# Patient Record
Sex: Male | Born: 1970
Health system: Southern US, Community
[De-identification: ages and names within clinical notes are randomized; demographics above are authoritative.]

## PROBLEM LIST (undated history)

## (undated) DIAGNOSIS — I10 Essential (primary) hypertension: Secondary | ICD-10-CM

---

## 2008-11-27 ENCOUNTER — Emergency Department: Payer: Self-pay | Admitting: Emergency Medicine

## 2011-11-13 ENCOUNTER — Emergency Department: Payer: Self-pay | Admitting: *Deleted

## 2011-11-13 LAB — URINALYSIS, COMPLETE
Blood: NEGATIVE
Hyaline Cast: 13
Leukocyte Esterase: NEGATIVE
Nitrite: NEGATIVE
Protein: 100
RBC,UR: 1 /HPF (ref 0–5)
Specific Gravity: 1.036 (ref 1.003–1.030)
Squamous Epithelial: NONE SEEN
WBC UR: 2 /HPF (ref 0–5)

## 2011-11-13 LAB — CBC
HCT: 44.8 % (ref 40.0–52.0)
HGB: 15.3 g/dL (ref 13.0–18.0)
MCH: 31.9 pg (ref 26.0–34.0)
MCHC: 34.2 g/dL (ref 32.0–36.0)
MCV: 93 fL (ref 80–100)

## 2011-11-13 LAB — COMPREHENSIVE METABOLIC PANEL
Albumin: 4.7 g/dL (ref 3.4–5.0)
Alkaline Phosphatase: 56 U/L (ref 50–136)
Anion Gap: 10 (ref 7–16)
Bilirubin,Total: 0.5 mg/dL (ref 0.2–1.0)
Calcium, Total: 9.6 mg/dL (ref 8.5–10.1)
Co2: 23 mmol/L (ref 21–32)
Creatinine: 1.06 mg/dL (ref 0.60–1.30)
EGFR (Non-African Amer.): >60
Osmolality: 281 (ref 275–301)
Potassium: 3.3 mmol/L — ABNORMAL LOW (ref 3.5–5.1)
SGOT(AST): 26 U/L (ref 15–37)
SGPT (ALT): 28 U/L
Sodium: 138 mmol/L (ref 136–145)
Total Protein: 8.6 g/dL — ABNORMAL HIGH (ref 6.4–8.2)

## 2011-11-13 LAB — LIPASE, BLOOD: Lipase: 67 U/L — ABNORMAL LOW (ref 73–393)

## 2014-07-08 ENCOUNTER — Ambulatory Visit: Payer: Self-pay | Admitting: Otolaryngology

## 2014-11-24 DIAGNOSIS — H7190 Unspecified cholesteatoma, unspecified ear: Secondary | ICD-10-CM | POA: Insufficient documentation

## 2015-07-19 HISTORY — PX: OTHER SURGICAL HISTORY: SHX169

## 2016-10-12 IMAGING — CT CT TEMPORAL BONES WITHOUT CONTRAST
2 of 6 series · 12 of 40 positions shown, 15 images · non-contrast
Comparison: None.

CLINICAL DATA: 43-year-old male with right-sided hearing loss for 3
months. Bloody leakage from ear last week. Initial encounter.

EXAM:
CT TEMPORAL BONES WITHOUT CONTRAST
TECHNIQUE: Axial and coronal plane CT imaging of the petrous temporal bones was
performed with thin-collimation image reconstruction. No intravenous
contrast was administered. Multiplanar CT image reconstructions were
also generated.

[Series 5: coronal bone · coronal · 0.11mm/px · 2 of 127 slices shown]
[im 43/127  bone]
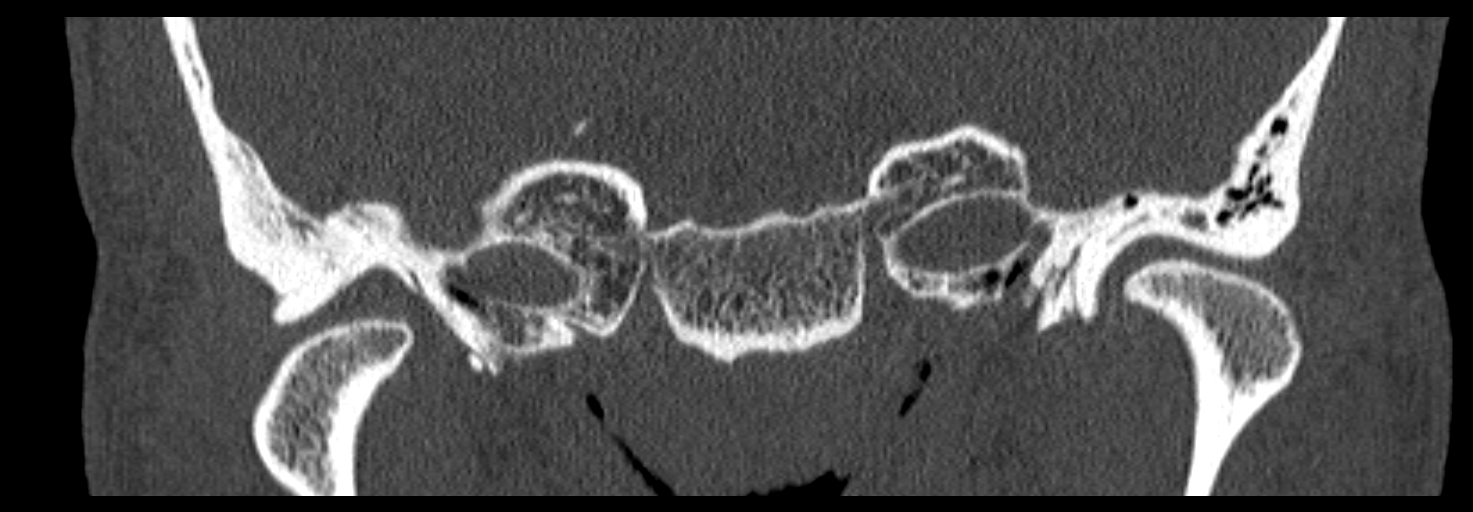
[im 85/127  bone]
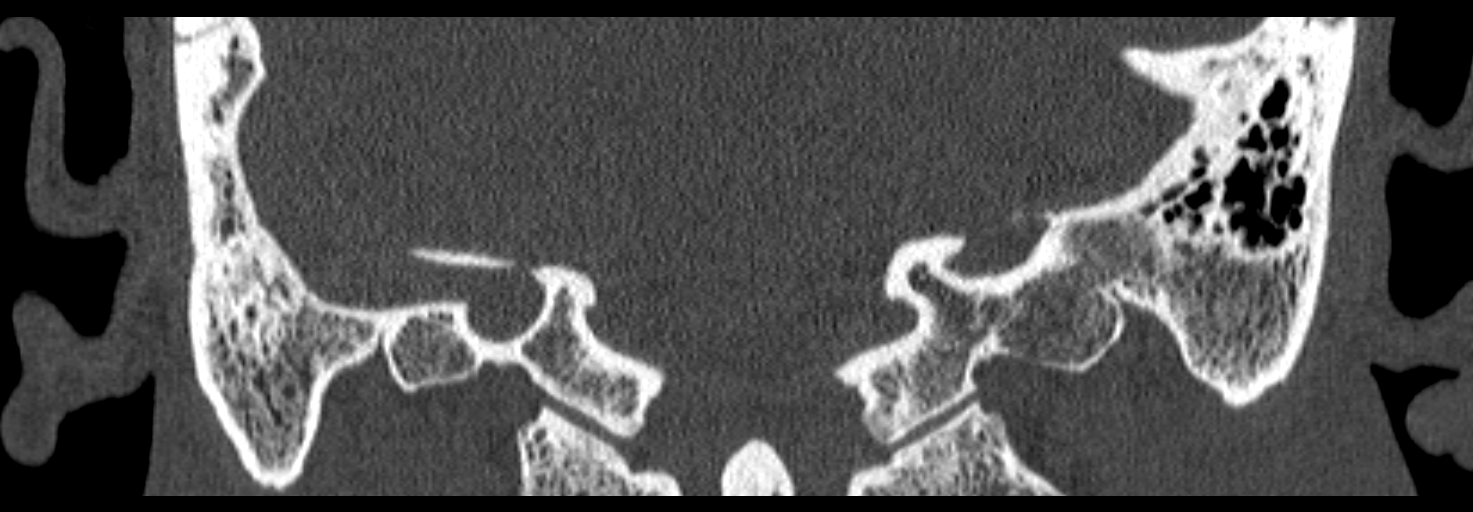

[Series 6: ax mag right · axial · 0.20mm/px · z∈[-64,-20]mm · 10 of 90 slices shown, 13 images]
[im 8/90  brain]
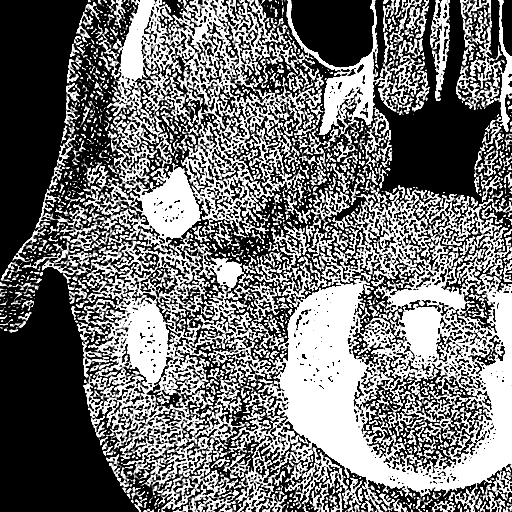
[im 8/90  bone]
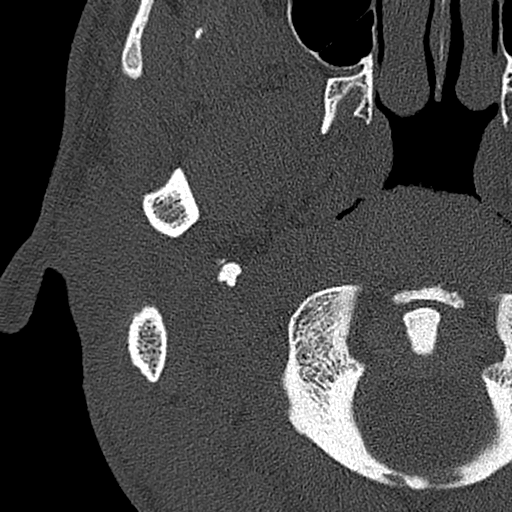
[im 15/90  bone]
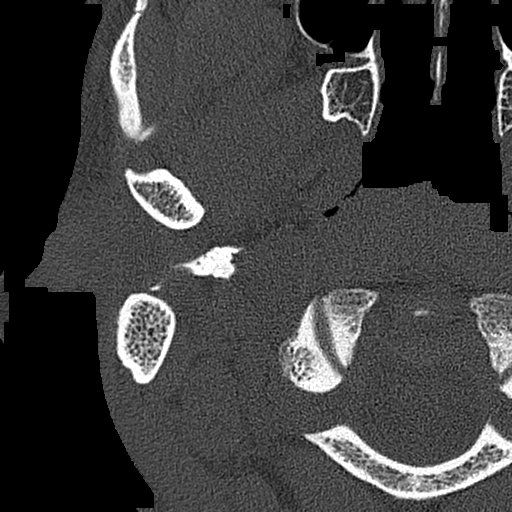
[im 23/90  bone]
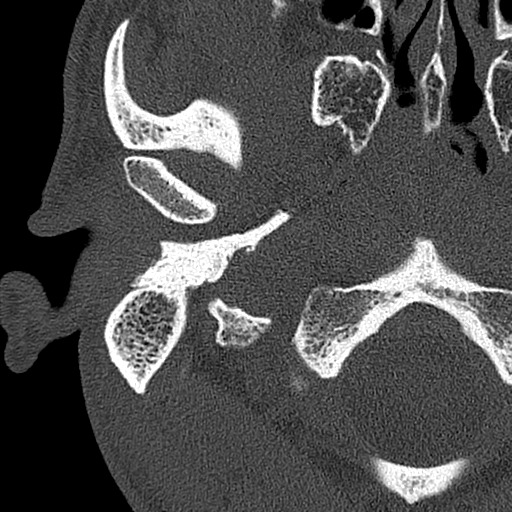
[im 30/90  bone]
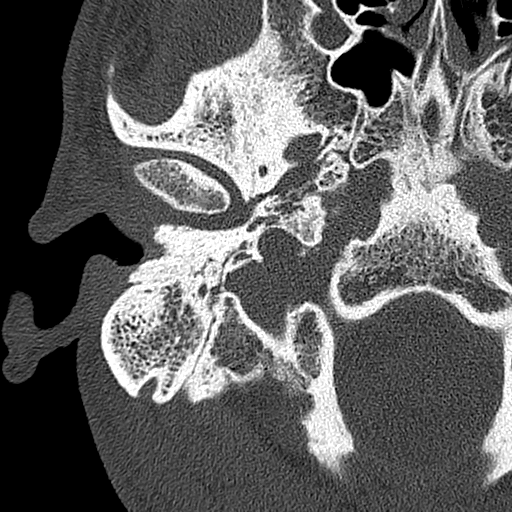
[im 38/90  brain]
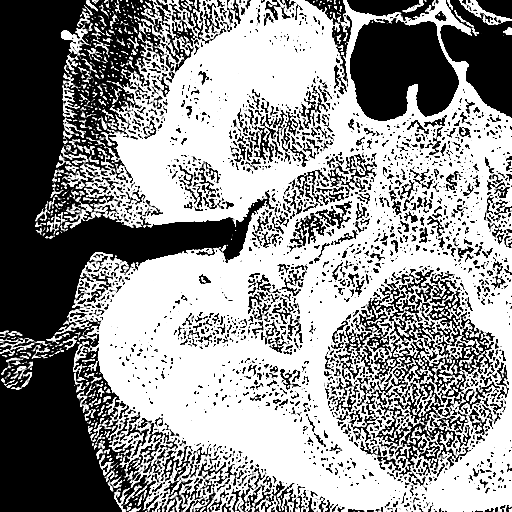
[im 38/90  bone]
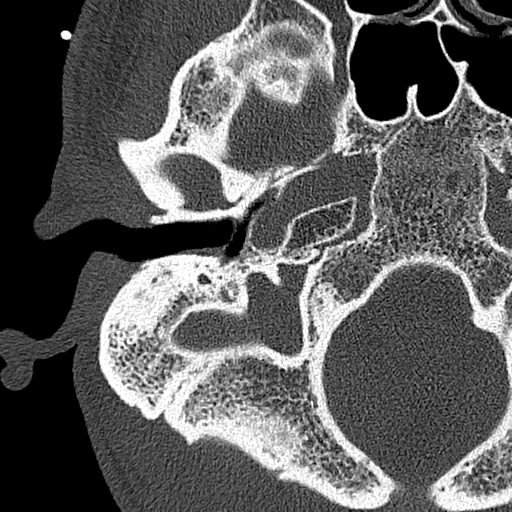
[im 52/90  bone]
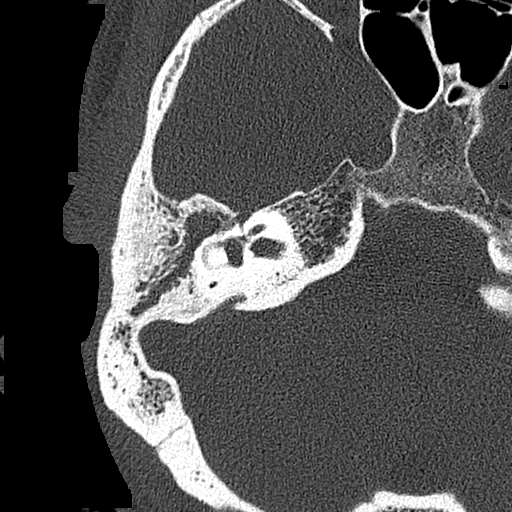
[im 60/90  bone]
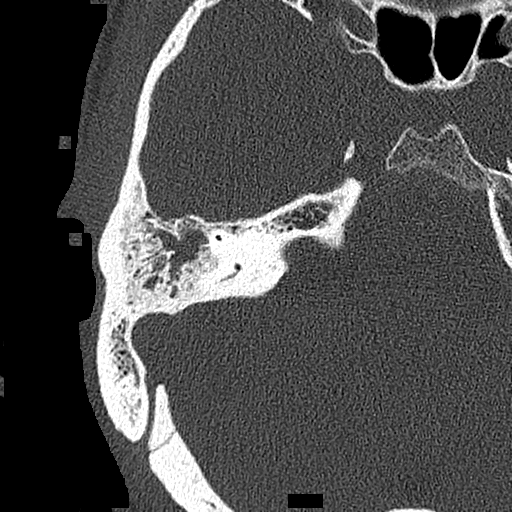
[im 67/90  bone]
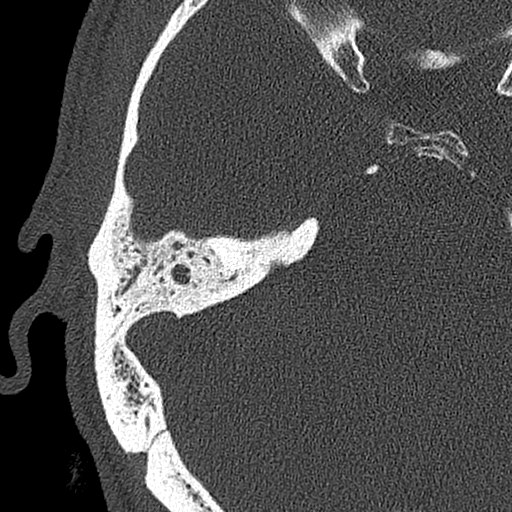
[im 75/90  brain]
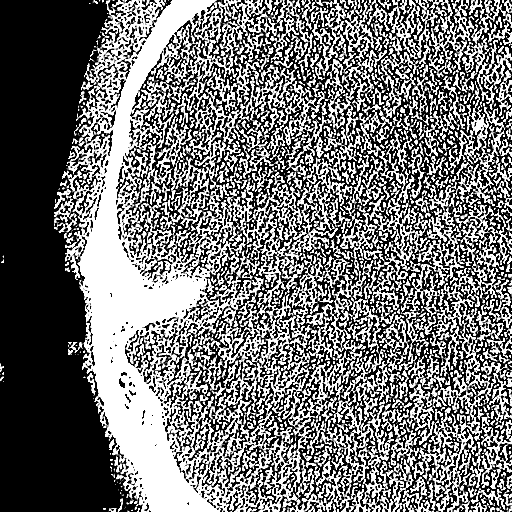
[im 75/90  bone]
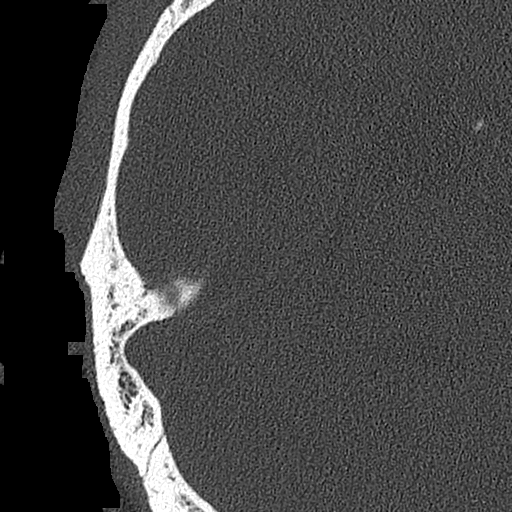
[im 82/90  bone]
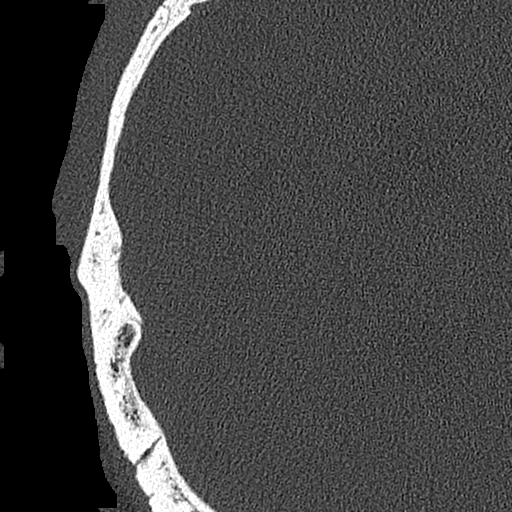

[12 of 40 positions shown; findings below may reference images not displayed]

FINDINGS: Right-side:

Complete opacification right mastoid air cells and right middle ear
cavity (including sinus tympani). No obvious obstructing lesion
causing eustachian tube dysfunction. Mastoid septations are intact.

Small portions of the right stapes visualized. Articulation with the
right incus not well delineated. Partial destruction can't therefore
not be excluded as can be seen with cholesteatoma. The scutum is
intact and at most minimally blunted.

Bony cover of the horizontal segment of the right seventh cranial
nerve may be partially dehiscent.

The roof of the right mastoid air cells and middle ear cavities is
slightly thin without dehiscence.

Top-normal size vestibular aqueduct.

Left side:

Left mastoid air cells and middle ear cavity are clear. Ossicles are
intact. Top-normal size left vestibular aqueduct.

Visualized intracranial structures unremarkable.

Minimal mucosal thickening maxillary sinuses and ethmoid sinus air
cells appear
IMPRESSION: Complete opacification right mastoid air cells and right middle ear
cavity (including sinus tympani). No obvious obstructing lesion
causing eustachian tube dysfunction. Mastoid septations are intact.

Small portions of the right stapes visualized. Articulation with the
right incus not well delineated. Partial destruction can't therefore
not be excluded as can be seen with cholesteatoma. The right scutum
is intact and at most minimally blunted.

Bony cover of the horizontal segment of the right seventh cranial
nerve may be partially dehiscent.

Left mastoid air cells and middle ear cavity are clear.

Please see above.

## 2017-12-04 ENCOUNTER — Encounter: Payer: Self-pay | Admitting: Family Medicine

## 2017-12-04 ENCOUNTER — Ambulatory Visit (INDEPENDENT_AMBULATORY_CARE_PROVIDER_SITE_OTHER): Payer: BLUE CROSS/BLUE SHIELD | Admitting: Family Medicine

## 2017-12-04 VITALS — BP 190/120 | HR 64 | Temp 98.6°F | Resp 16 | Ht 68.5 in | Wt 142.0 lb

## 2017-12-04 DIAGNOSIS — Z0000 Encounter for general adult medical examination without abnormal findings: Secondary | ICD-10-CM | POA: Diagnosis not present

## 2017-12-04 DIAGNOSIS — Z1159 Encounter for screening for other viral diseases: Secondary | ICD-10-CM | POA: Diagnosis not present

## 2017-12-04 DIAGNOSIS — Z23 Encounter for immunization: Secondary | ICD-10-CM

## 2017-12-04 DIAGNOSIS — I10 Essential (primary) hypertension: Secondary | ICD-10-CM | POA: Insufficient documentation

## 2017-12-04 MED ORDER — HYDROCHLOROTHIAZIDE 25 MG PO TABS: 25.0000 mg | ORAL_TABLET | Freq: Every day | ORAL | 2 refills | Status: DC

## 2017-12-04 NOTE — Progress Notes (Signed)
Patient: Erik Rodriguez, Male    DOB: 1970-11-17, 47 y.o.   MRN: [id number] Visit Date: 12/04/2017  Today's Provider: Mila Merry, MD   Chief Complaint  Patient presents with  . Establish Care  . Annual Exam   Subjective:  New patient establishing Care: Patient reports that he has not seen a PCP in several years.     Annual physical exam Erik Rodriguez is a 47 y.o. male who presents today for health maintenance and complete physical. He feels fairly well. He reports exercising daily. He reports he is sleeping fairly well.  He reports that he was incarcerated for 3 years and been out for the last 8. He was diagnosed with high blood pressure while in prison and on hctz and lisinopril, but has not taken for several years. He feels well, but home Bps have been elevated. He does report some tingling in his feet and is concerned about diabetes which runs in his family   He is currently working Insurance claims handler. He rarely drinks alcohol but does smoke cigars with marijuana. He denies using any other illicit drugs.  -----------------------------------------------------------------   Review of Systems  Constitutional: Positive for appetite change, chills, diaphoresis and fatigue. Negative for fever.  HENT: Negative for congestion, ear pain, hearing loss, nosebleeds and trouble swallowing.   Eyes: Negative for pain and visual disturbance.  Respiratory: Negative for cough, chest tightness and shortness of breath.   Cardiovascular: Negative for chest pain, palpitations and leg swelling.  Gastrointestinal: Positive for abdominal pain, blood in stool and nausea. Negative for constipation, diarrhea and vomiting.  Endocrine: Negative for polydipsia, polyphagia and polyuria.  Genitourinary: Positive for frequency. Negative for dysuria and flank pain.  Musculoskeletal: Negative for arthralgias, back pain, joint swelling, myalgias and neck stiffness.  Skin: Negative for color  change, rash and wound.  Neurological: Negative for dizziness, tremors, seizures, speech difficulty, weakness, light-headedness and headaches.  Psychiatric/Behavioral: Negative for behavioral problems, confusion, decreased concentration, dysphoric mood and sleep disturbance. The patient is not nervous/anxious.   All other systems reviewed and are negative.   Social History      He  reports that he has quit smoking. He has never used smokeless tobacco. He reports that he does not drink alcohol.       Social History   Socioeconomic History  . Marital status: Single    Spouse name: Not on file  . Number of children: 3  . Years of education: Not on file  . Highest education level: Not on file  Occupational History  . Occupation: Location manager  Social Needs  . Financial resource strain: Not on file  . Food insecurity:    Worry: Not on file    Inability: Not on file  . Transportation needs:    Medical: Not on file    Non-medical: Not on file  Tobacco Use  . Smoking status: Former Games developer  . Smokeless tobacco: Never Used  Substance and Sexual Activity  . Alcohol use: Never    Frequency: Never  . Drug use: Not on file  . Sexual activity: Not on file  Lifestyle  . Physical activity:    Days per week: Not on file    Minutes per session: Not on file  . Stress: Not on file  Relationships  . Social connections:    Talks on phone: Not on file    Gets together: Not on file    Attends religious service: Not on file  Active member of club or organization: Not on file    Attends meetings of clubs or organizations: Not on file    Relationship status: Not on file  Other Topics Concern  . Not on file  Social History Narrative  . Not on file    No past medical history on file.   There are no active problems to display for this patient.   Past Surgical History:  Procedure Laterality Date  . right ear surgery  2017   due to clematoma    Family History        Family  Status  Relation Name Status  . Mother  Deceased  . Father  Alive  . MGM  (Not Specified)        His family history includes Cancer in his father; Diabetes in his father; Hypertension in his father, maternal grandmother, and mother.      No Known Allergies  No current outpatient medications on file.   Patient Care Team: Malva Limes, MD as PCP - General (Family Medicine)      Objective:   Vitals: BP (!) 190/120 (BP Location: Right Arm, Cuff Size: Normal)   Pulse 64   Temp 98.6 F (37 C) (Oral)   Resp 16   Ht 5' 8.5" (1.74 m)   Wt 142 lb (64.4 kg)   SpO2 99% Comment: room air  BMI 21.28 kg/m    Vitals:   12/04/17 0924 12/04/17 0928  BP: (!) 182/120 (!) 190/120  Pulse: 64   Resp: 16   Temp: 98.6 F (37 C)   TempSrc: Oral   SpO2: 99%   Weight: 142 lb (64.4 kg)   Height: 5' 8.5" (1.74 m)      Physical Exam   General Appearance:    Alert, cooperative, no distress, appears stated age  Head:    Normocephalic, without obvious abnormality, atraumatic  Eyes:    PERRL, conjunctiva/corneas clear, EOM's intact, fundi    benign, both eyes       Ears:    Normal TM's and external ear canals, both ears  Nose:   Nares normal, septum midline, mucosa normal, no drainage   or sinus tenderness  Throat:   Lips, mucosa, and tongue normal; teeth and gums normal  Neck:   Supple, symmetrical, trachea midline, no adenopathy;       thyroid:  No enlargement/tenderness/nodules; no carotid   bruit or JVD  Back:     Symmetric, no curvature, ROM normal, no CVA tenderness  Lungs:     Clear to auscultation bilaterally, respirations unlabored  Chest wall:    No tenderness or deformity  Heart:    Regular rate and rhythm, S1 and S2 normal, no murmur, rub   or gallop  Abdomen:     Soft, non-tender, bowel sounds active all four quadrants,    no masses, no organomegaly  Genitalia:    deferred  Rectal:    deferred  Extremities:   Extremities normal, atraumatic, no cyanosis or edema    Pulses:   2+ and symmetric all extremities  Skin:   Skin color, texture, turgor normal, no rashes or lesions  Lymph nodes:   Cervical, supraclavicular, and axillary nodes normal  Neurologic:   CNII-XII intact. Normal strength, sensation and reflexes      throughout    Depression Screen PHQ 2/9 Scores 12/04/2017  PHQ - 2 Score 2  PHQ- 9 Score 7      Assessment & Plan:  Routine Health Maintenance and Physical Exam  Exercise Activities and Dietary recommendations Goals    None       There is no immunization history on file for this patient.  Health Maintenance  Topic Date Due  . HIV Screening  11/30/1985  . TETANUS/TDAP  11/30/1989  . INFLUENZA VACCINE  02/15/2018     Discussed health benefits of physical activity, and encouraged him to engage in regular exercise appropriate for his age and condition.    --------------------------------------------------------------------  1. Annual physical exam  - HIV antibody (with reflex) - Comprehensive metabolic panel - Lipid panel - Hepatitis C antibody - EKG 12-Lead  2. Need for hepatitis C screening test  - Hepatitis C antibody  3. Essential hypertension Previously on hctz and lisinopril which he tolerated well. restart- hydrochlorothiazide (HYDRODIURIL) 25 MG tablet; Take 1 tablet (25 mg total) by mouth daily.  Dispense: 30 tablet; Refill: 2 and follow up for BP check in a few weeks.  - Comprehensive metabolic panel - Lipid panel - EKG 12-Lead  4. Need for prophylactic vaccination using tetanus and diphtheria toxoids adsorbed (Td) vaccine  - Td vaccine greater than or equal to 7yo preservative free IM   Mila Merry, MD  [facility]

## 2017-12-05 LAB — COMPREHENSIVE METABOLIC PANEL
ALT: 15 IU/L (ref 0–44)
AST: 20 IU/L (ref 0–40)
Albumin/Globulin Ratio: 1.8 (ref 1.2–2.2)
Albumin: 4.6 g/dL (ref 3.5–5.5)
Alkaline Phosphatase: 51 IU/L (ref 39–117)
BILIRUBIN TOTAL: 0.4 mg/dL (ref 0.0–1.2)
BUN/Creatinine Ratio: 13 (ref 9–20)
BUN: 14 mg/dL (ref 6–24)
CO2: 23 mmol/L (ref 20–29)
CREATININE: 1.05 mg/dL (ref 0.76–1.27)
Calcium: 9.4 mg/dL (ref 8.7–10.2)
Chloride: 101 mmol/L (ref 96–106)
GFR calc Af Amer: 97 mL/min/{1.73_m2} (ref >59)
GFR, EST NON AFRICAN AMERICAN: 84 mL/min/{1.73_m2} (ref >59)
GLUCOSE: 74 mg/dL (ref 65–99)
Globulin, Total: 2.6 g/dL (ref 1.5–4.5)
Potassium: 4.2 mmol/L (ref 3.5–5.2)
Sodium: 140 mmol/L (ref 134–144)
Total Protein: 7.2 g/dL (ref 6.0–8.5)

## 2017-12-05 LAB — HEPATITIS C ANTIBODY: HEP C VIRUS AB: 0.1 {s_co_ratio} (ref 0.0–0.9)

## 2017-12-05 LAB — LIPID PANEL
Chol/HDL Ratio: 2 ratio (ref 0.0–5.0)
Cholesterol, Total: 180 mg/dL (ref 100–199)
HDL: 89 mg/dL (ref >39)
LDL CALC: 75 mg/dL (ref 0–99)
Triglycerides: 79 mg/dL (ref 0–149)
VLDL CHOLESTEROL CAL: 16 mg/dL (ref 5–40)

## 2017-12-05 LAB — HIV ANTIBODY (ROUTINE TESTING W REFLEX): HIV SCREEN 4TH GENERATION: NONREACTIVE

## 2017-12-25 ENCOUNTER — Encounter: Payer: Self-pay | Admitting: Family Medicine

## 2017-12-25 ENCOUNTER — Ambulatory Visit: Payer: BLUE CROSS/BLUE SHIELD | Admitting: Family Medicine

## 2017-12-25 VITALS — BP 180/118 | HR 76 | Temp 99.1°F | Resp 16 | Wt 142.0 lb

## 2017-12-25 DIAGNOSIS — I10 Essential (primary) hypertension: Secondary | ICD-10-CM

## 2017-12-25 MED ORDER — LISINOPRIL 10 MG PO TABS: 10.0000 mg | ORAL_TABLET | Freq: Every day | ORAL | 1 refills | Status: DC

## 2017-12-25 NOTE — Progress Notes (Signed)
       Patient: Erik Rodriguez Male    DOB: 30-Dec-1970   47 y.o.   MRN: [id number] Visit Date: 12/25/2017  Today's Provider: Mila Merryonald Zakir Henner, MD   Chief Complaint  Patient presents with  . Hypertension   Subjective:    HPI   Hypertension, follow-up:  BP Readings from Last 3 Encounters:  12/25/17 (!) 180/118  12/04/17 (!) 190/120    He was last seen for hypertension 3 weeks ago.  BP at that visit was 190./120. Management since that visit includes; restarted HCTZ 25 mg qd and advised to follow-up in a few weeks for bp check.He reports good compliance with treatment, although he states he missed 4 days of medication a couple of weeks ago when he left to go out of town. He has been back on medication for the last 2 weeks.  He is not having side effects.  He is exercising. He is not adherent to low salt diet.   Outside blood pressures are not checked. He is experiencing none.  Patient denies chest pain, chest pressure/discomfort, claudication, dyspnea, exertional chest pressure/discomfort, fatigue, irregular heart beat, lower extremity edema, near-syncope, orthopnea, palpitations, paroxysmal nocturnal dyspnea, syncope and tachypnea.   Cardiovascular risk factors include hypertension and male gender.  Use of agents associated with hypertension: none.   He states he was previously taking lisinopril and hctz which he states worked well.  ------------------------------------------------------------------------    No Known Allergies   Current Outpatient Medications:  .  hydrochlorothiazide (HYDRODIURIL) 25 MG tablet, Take 1 tablet (25 mg total) by mouth daily., Disp: 30 tablet, Rfl: 2  Review of Systems  Constitutional: Negative for appetite change, chills and fever.  Respiratory: Negative for chest tightness, shortness of breath and wheezing.   Cardiovascular: Negative for chest pain and palpitations.  Gastrointestinal: Negative for abdominal pain, nausea and vomiting.     Social History   Tobacco Use  . Smoking status: Former Games developermoker  . Smokeless tobacco: Never Used  Substance Use Topics  . Alcohol use: Never    Frequency: Never   Objective:   BP (!) 180/118 (BP Location: Right Arm, Patient Position: Sitting, Cuff Size: Normal)   Pulse 76   Temp 99.1 F (37.3 C) (Oral)   Resp 16   Wt 142 lb (64.4 kg)   SpO2 99% Comment: room air  BMI 21.28 kg/m  Vitals:   12/25/17 0828  BP: (!) 180/118  Pulse: 76  Resp: 16  Temp: 99.1 F (37.3 C)  TempSrc: Oral  SpO2: 99%  Weight: 142 lb (64.4 kg)     Physical Exam  General appearance: alert, well developed, well nourished, cooperative and in no distress Head: Normocephalic, without obvious abnormality, atraumatic Respiratory: Respirations even and unlabored, normal respiratory rate      Assessment & Plan:     1. Essential hypertension Continue hctz 25, add- lisinopril (PRINIVIL,ZESTRIL) 10 MG tablet; Take 1 tablet (10 mg total) by mouth daily.  Dispense: 30 tablet; Refill: 1 Recheck 10-14 days .  Consider adding CCB       Mila Merryonald Nyela Cortinas, MD  [facility]

## 2018-01-04 NOTE — Progress Notes (Signed)
       Patient: Erik Rodriguez Male    DOB: 09-27-1970   47 y.o.   MRN: [id number] Visit Date: 01/05/2018  Today's Provider: Mila Merryonald Fisher, MD   Chief Complaint  Patient presents with  . Follow-up  . Hypertension   Subjective:    HPI   Hypertension, follow-up:  BP Readings from Last 3 Encounters:  01/05/18 (!) 140/110  12/25/17 (!) 180/118  12/04/17 (!) 190/120    He was last seen for hypertension 10 days ago.  BP at that visit was 180/118. Management since that visit includes; added lisinopril 10 mg qd. Advised to follow up in 10-14 days. Will consider adding CCB.He reports good compliance with treatment. He is not having side effects. none He is exercising. He is not adherent to low salt diet.   Outside blood pressures are not checking. He is experiencing none.  Patient denies none.   Cardiovascular risk factors include none.  Use of agents associated with hypertension: none -----------------------------------------------------------------    No Known Allergies   Current Outpatient Medications:  .  hydrochlorothiazide (HYDRODIURIL) 25 MG tablet, Take 1 tablet (25 mg total) by mouth daily., Disp: 30 tablet, Rfl: 2 .  lisinopril (PRINIVIL,ZESTRIL) 10 MG tablet, Take 1 tablet (10 mg total) by mouth daily., Disp: 30 tablet, Rfl: 1  Review of Systems  Constitutional: Negative for appetite change, chills and fever.  Respiratory: Negative for chest tightness, shortness of breath and wheezing.   Cardiovascular: Negative for chest pain and palpitations.  Gastrointestinal: Negative for abdominal pain, nausea and vomiting.    Social History   Tobacco Use  . Smoking status: Current Some Day Smoker  . Smokeless tobacco: Never Used  . Tobacco comment: 1 cigarette daily  Substance Use Topics  . Alcohol use: Never    Frequency: Never   Objective:    Vitals:   01/05/18 0817 01/05/18 0826  BP: (!) 140/110 (!) 142/98  Pulse: 67   Resp: 16   Temp: 97.6 F  (36.4 C)   TempSrc: Oral   SpO2: 99%   Weight: 137 lb (62.1 kg)   Height: 5\' 9"  (1.753 m)      Physical Exam  General appearance: alert, well developed, well nourished, cooperative and in no distress Head: Normocephalic, without obvious abnormality, atraumatic Respiratory: Respirations even and unlabored, normal respiratory rate      Assessment & Plan:     1. Essential hypertension Improving, but not at goal.  Check labs. If normal will increase lisinopril to 20mg  daily.  - Renal function panel       Mila Merryonald Fisher, MD  [facility]

## 2018-01-05 ENCOUNTER — Encounter: Payer: Self-pay | Admitting: Family Medicine

## 2018-01-05 ENCOUNTER — Ambulatory Visit: Payer: BLUE CROSS/BLUE SHIELD | Admitting: Family Medicine

## 2018-01-05 VITALS — BP 142/98 | HR 67 | Temp 97.6°F | Resp 16 | Ht 69.0 in | Wt 137.0 lb

## 2018-01-05 DIAGNOSIS — I10 Essential (primary) hypertension: Secondary | ICD-10-CM

## 2018-01-06 LAB — RENAL FUNCTION PANEL
Albumin: 4.6 g/dL (ref 3.5–5.5)
BUN / CREAT RATIO: 13 (ref 9–20)
BUN: 14 mg/dL (ref 6–24)
CALCIUM: 9.7 mg/dL (ref 8.7–10.2)
CHLORIDE: 99 mmol/L (ref 96–106)
CO2: 23 mmol/L (ref 20–29)
CREATININE: 1.06 mg/dL (ref 0.76–1.27)
GFR calc Af Amer: 96 mL/min/{1.73_m2} (ref >59)
GFR calc non Af Amer: 83 mL/min/{1.73_m2} (ref >59)
GLUCOSE: 81 mg/dL (ref 65–99)
PHOSPHORUS: 2.7 mg/dL (ref 2.5–4.5)
POTASSIUM: 3.7 mmol/L (ref 3.5–5.2)
SODIUM: 139 mmol/L (ref 134–144)

## 2018-01-10 ENCOUNTER — Telehealth: Payer: Self-pay | Admitting: *Deleted

## 2018-01-10 DIAGNOSIS — I10 Essential (primary) hypertension: Secondary | ICD-10-CM

## 2018-01-10 MED ORDER — LISINOPRIL 20 MG PO TABS: 20.0000 mg | ORAL_TABLET | Freq: Every day | ORAL | 1 refills | Status: DC

## 2018-01-10 NOTE — Telephone Encounter (Signed)
Patient was notified of results. Expressed understanding. Rx sent to pharmacy. 

## 2018-01-10 NOTE — Telephone Encounter (Signed)
Left message for pt to call back  °

## 2018-01-10 NOTE — Telephone Encounter (Signed)
-----   Message from Malva Limesonald E Fisher, MD sent at 01/07/2018  9:27 PM EDT ----- Kidney functions and electrolytes all good. Need to increase lisinopril to 20mg  once a day, please sen in prescription for #30 tablets with one refill and schedule follow up for BP check in one month.

## 2018-02-09 ENCOUNTER — Ambulatory Visit: Payer: Self-pay | Admitting: Family Medicine

## 2018-02-13 NOTE — Progress Notes (Signed)
       Patient: Erik Rodriguez Male    DOB: 12/12/1970   47 y.o.   MRN: [id number] Visit Date: 02/14/2018  Today's Provider: Mila Merryonald Milon Dethloff, MD   Chief Complaint  Patient presents with  . Hypertension  . Follow-up   Subjective:    HPI   Hypertension, follow-up:  BP Readings from Last 3 Encounters:  02/14/18 (!) 164/99  01/05/18 (!) 142/98  12/25/17 (!) 180/118    He was last seen for hypertension 1 months ago.  BP at that visit was 148/98. Management since that visit includes; increased Lisinopril to 20 mg qd.He reports fair compliance with treatment. He states he has not took the medication the past week. He went out of town for a funeral and forgot the medication. He restarted taking the medication on Sunday.  He is not having side effects.  He is not exercising regularly. He reports having a strenuous job. [employer]. He is not adherent to low salt diet.   Outside blood pressures are not being checked. He is experiencing none.  Patient denies chest pain, chest pressure/discomfort, claudication, dyspnea, exertional chest pressure/discomfort, fatigue, irregular heart beat, lower extremity edema, near-syncope, orthopnea, palpitations, paroxysmal nocturnal dyspnea and syncope.   Cardiovascular risk factors include hypertension and male gender.  Use of agents associated with hypertension: none.   ------------------------------------------------------------------------   No Known Allergies   Current Outpatient Medications:  .  hydrochlorothiazide (HYDRODIURIL) 25 MG tablet, Take 1 tablet (25 mg total) by mouth daily., Disp: 30 tablet, Rfl: 2 .  lisinopril (PRINIVIL,ZESTRIL) 20 MG tablet, Take 1 tablet (20 mg total) by mouth daily., Disp: 30 tablet, Rfl: 1  Review of Systems  Constitutional: Negative for appetite change, chills and fever.  Respiratory: Negative for chest tightness, shortness of breath and wheezing.   Cardiovascular: Negative for chest pain and palpitations.    Gastrointestinal: Negative for abdominal pain, nausea and vomiting.    Social History   Tobacco Use  . Smoking status: Current Some Day Smoker  . Smokeless tobacco: Never Used  . Tobacco comment: 1 cigarette daily  Substance Use Topics  . Alcohol use: Never    Frequency: Never   Objective:    Vitals:   02/14/18 0814 02/14/18 0826  BP: (!) 164/99 (!) 154/98  Pulse: 62   Temp: 98.1 F (36.7 C)   TempSrc: Oral   SpO2: 98%   Weight: 146 lb 6.4 oz (66.4 kg)      Physical Exam  General appearance: alert, well developed, well nourished, cooperative and in no distress Head: Normocephalic, without obvious abnormality, atraumatic Respiratory: Respirations even and unlabored, normal respiratory rate .     Assessment & Plan:     1. Essential hypertension Much better since getting back on lisinopril and hctz, but not to goal since he was off medications last week. Weill continue lisinopril 20 and hctz 25 for now. Recheck in 3 months consider increasing lisinopril to 40 if not improved at follow up.        Mila Merryonald Stasha Naraine, MD  [facility]

## 2018-02-14 ENCOUNTER — Ambulatory Visit (INDEPENDENT_AMBULATORY_CARE_PROVIDER_SITE_OTHER): Payer: BLUE CROSS/BLUE SHIELD | Admitting: Family Medicine

## 2018-02-14 ENCOUNTER — Encounter: Payer: Self-pay | Admitting: Family Medicine

## 2018-02-14 VITALS — BP 154/98 | HR 62 | Temp 98.1°F | Wt 146.4 lb

## 2018-02-14 DIAGNOSIS — I10 Essential (primary) hypertension: Secondary | ICD-10-CM | POA: Diagnosis not present

## 2018-05-16 ENCOUNTER — Ambulatory Visit: Payer: BLUE CROSS/BLUE SHIELD | Admitting: Family Medicine

## 2018-11-28 ENCOUNTER — Telehealth: Payer: Self-pay

## 2018-11-28 NOTE — Telephone Encounter (Signed)
Pt failed PHQ-9 (Depression) screening 12/04/2017. Pt is also due for a physical after 12/04/2017.  Please schedule when pt calls back.   LMTCB 11/28/2018.   Thanks,   -Vernona Rieger

## 2019-05-19 DIAGNOSIS — R21 Rash and other nonspecific skin eruption: Secondary | ICD-10-CM | POA: Diagnosis not present

## 2019-05-21 ENCOUNTER — Other Ambulatory Visit: Payer: Self-pay

## 2019-05-21 ENCOUNTER — Ambulatory Visit: Payer: BC Managed Care – PPO | Admitting: Family Medicine

## 2019-05-21 ENCOUNTER — Encounter: Payer: Self-pay | Admitting: Family Medicine

## 2019-05-21 DIAGNOSIS — A539 Syphilis, unspecified: Secondary | ICD-10-CM

## 2019-05-21 DIAGNOSIS — Z113 Encounter for screening for infections with a predominantly sexual mode of transmission: Secondary | ICD-10-CM

## 2019-05-21 LAB — GRAM STAIN

## 2019-05-21 MED ORDER — PENICILLIN G BENZATHINE 1200000 UNIT/2ML IM SUSP
2.4000 10*6.[IU] | Freq: Once | INTRAMUSCULAR | Status: AC
  Administered 2019-05-21: 2.4 10*6.[IU] via INTRAMUSCULAR

## 2019-05-21 NOTE — Progress Notes (Addendum)
Reviewed Gram Stain results. Per standing orders no treatment indicated. Patient treated for Syphilis per provider orders Hal Morales, RN

## 2019-05-21 NOTE — Progress Notes (Signed)
    STI clinic/screening visit  Subjective:  Braheem Tomasik is a 48 y.o. male being seen today for an STI screening visit. The patient reports they do have symptoms.  Patient has the following medical conditions:   Patient Active Problem List   Diagnosis Date Noted  . Hypertension 12/04/2017  . Cholesteatoma 11/24/2014     No chief complaint on file.   HPI  Patient reports that he has had a rash on his trunk and upper legs since 05/05/2019.  He remembers that he had a painless, red-"raw looking", shiny lesion in his pubic area prior to the rash.  He states that the lesion has healed. States that he had sex on 04/28/2019 with new partner.  He has been trying to contact her about his symptoms but she hasn't responded. He denies other STD symptoms. See flowsheet for further details and programmatic requirements.    The following portions of the patient's history were reviewed and updated as appropriate: allergies, current medications, past medical history, past social history, past surgical history and problem list.  Objective:  There were no vitals filed for this visit.  Physical Exam Constitutional:      Appearance: Normal appearance.  HENT:     Mouth/Throat:     Mouth: Mucous membranes are moist.     Pharynx: Oropharynx is clear. No oropharyngeal exudate or posterior oropharyngeal erythema.  Neck:     Musculoskeletal: Neck supple. No muscular tenderness.  Abdominal:     Palpations: Abdomen is soft. There is no mass.     Tenderness: There is no abdominal tenderness.     Hernia: There is no hernia in the left inguinal area or right inguinal area.  Genitourinary:    Penis: Normal.      Scrotum/Testes: Normal.     Epididymis:     Right: Normal.     Left: Normal.     Comments: Mid pubic area- 2 cm x 1 cm hyperpigmented healed area Lymphadenopathy:     Cervical: No cervical adenopathy.     Lower Body: No right inguinal adenopathy. No left inguinal adenopathy.   Skin:    General: Skin is warm.     Findings: Lesion and rash present.     Comments: To numerous to count erythematous lesions noted on  Trunk, arms, groin and upper thighs.  No discharge noted  Neurological:     Mental Status: He is alert.    Assessment and Plan:  Florian Chauca is a 48 y.o. male presenting to the [facility] Department for STI screening  1. Screening examination for venereal disease - Gram stain - Gonococcus culture - HIV/HCV [place] [facility] - Syphilis Serology, [place] [facility] - Gonococcus culture  2.  Syphillis Treat with Bicillin LA 2.4 mu IM  Co to notify partner to be evaluated and treatment for syphilis Co client that he would be contacted by [facility] if his tests results are + for syphilis, etc. Co not to be sexually active until all lab results have been received. Co. Condoms always for STD prevention   No follow-ups on file.  No future appointments.  Hassell Done, FNP

## 2019-05-26 LAB — GONOCOCCUS CULTURE

## 2019-05-27 LAB — HM HIV SCREENING LAB: HM HIV Screening: NEGATIVE

## 2019-05-27 LAB — HM HEPATITIS C SCREENING LAB: HM Hepatitis Screen: NEGATIVE

## 2019-07-02 ENCOUNTER — Other Ambulatory Visit: Payer: Self-pay

## 2019-07-02 ENCOUNTER — Ambulatory Visit: Payer: BC Managed Care – PPO

## 2019-07-02 DIAGNOSIS — Z719 Counseling, unspecified: Secondary | ICD-10-CM

## 2019-08-09 DIAGNOSIS — Z6823 Body mass index (BMI) 23.0-23.9, adult: Secondary | ICD-10-CM | POA: Diagnosis not present

## 2019-08-09 DIAGNOSIS — H7191 Unspecified cholesteatoma, right ear: Secondary | ICD-10-CM | POA: Diagnosis not present

## 2019-08-09 DIAGNOSIS — H6123 Impacted cerumen, bilateral: Secondary | ICD-10-CM | POA: Diagnosis not present

## 2019-08-30 DIAGNOSIS — H7191 Unspecified cholesteatoma, right ear: Secondary | ICD-10-CM | POA: Diagnosis not present

## 2019-08-30 DIAGNOSIS — Z6823 Body mass index (BMI) 23.0-23.9, adult: Secondary | ICD-10-CM | POA: Diagnosis not present

## 2019-08-30 DIAGNOSIS — M799 Soft tissue disorder, unspecified: Secondary | ICD-10-CM | POA: Diagnosis not present

## 2019-09-09 ENCOUNTER — Encounter: Payer: Self-pay | Admitting: Physician Assistant

## 2019-09-09 ENCOUNTER — Ambulatory Visit: Payer: Self-pay | Admitting: Physician Assistant

## 2019-09-09 ENCOUNTER — Other Ambulatory Visit: Payer: Self-pay

## 2019-09-09 DIAGNOSIS — Z113 Encounter for screening for infections with a predominantly sexual mode of transmission: Secondary | ICD-10-CM

## 2019-09-09 NOTE — Progress Notes (Signed)
   [facility] Department STI clinic/screening visit  Subjective:  Erik Rodriguez is a 49 y.o. male being seen today for an STI screening visit. The patient reports they do have symptoms.    Patient has the following medical conditions:   Patient Active Problem List   Diagnosis Date Noted  . Hypertension 12/04/2017  . Cholesteatoma 11/24/2014     Chief Complaint  Patient presents with  . SEXUALLY TRANSMITTED DISEASE    HPI  Patient reports that he has a "bump" in the genital area that has been present about 5 days.  States that it is not painful and does not have any drainage from it.  States it is tender if touched but otherwise does not bother him.  Declines other testing unless necessary and only wants "bump" looked at today.  States has ear surgery scheduled in about 2 months.    See flowsheet for further details and programmatic requirements.    The following portions of the patient's history were reviewed and updated as appropriate: allergies, current medications, past medical history, past social history, past surgical history and problem list.  Objective:  There were no vitals filed for this visit.  Physical Exam Constitutional:      General: He is not in acute distress.    Appearance: Normal appearance. He is normal weight.  HENT:     Head: Normocephalic and atraumatic.     Comments: No nits, lice, or hair loss. No cervical, supraclavicular or axillary adenopathy.    Mouth/Throat:     Mouth: Mucous membranes are moist.     Pharynx: Oropharynx is clear. No oropharyngeal exudate or posterior oropharyngeal erythema.  Eyes:     Conjunctiva/sclera: Conjunctivae normal.  Pulmonary:     Effort: Pulmonary effort is normal.  Abdominal:     Palpations: Abdomen is soft.  Genitourinary:    Penis: Normal.      Testes: Normal.     Comments: Pubic area without nits, lice, edema, erythema, lesions and inguinal adenopathy. Penis uncircumcised and without  rash, lesion or discharge at meatus. Left side of scrotum near base of penis with ~42mm x 53mm firm, pinkish, non-ulcerative, nt, papular area.  No exudate or fluctuance. Musculoskeletal:     Cervical back: Neck supple. No tenderness.  Skin:    General: Skin is warm and dry.     Findings: No bruising, erythema, lesion or rash.  Neurological:     Mental Status: He is alert and oriented to person, place, and time.  Psychiatric:        Mood and Affect: Mood normal.        Behavior: Behavior normal.        Thought Content: Thought content normal.        Judgment: Judgment normal.       Assessment and Plan:  Erik Rodriguez is a 49 y.o. male presenting to the [facility] Department for STI screening  1. Screening for STD (sexually transmitted disease) Patient into clinic with symptom.  Declines blood work and other testing unless necessary today.  Requests check of "bump". Counseled patient that the area could be a developing boil or sebaceous cyst. Rec to leave alone and not pick at or squeeze, can try warm compresses for discomfort. Rec to follow up with PCP or Dermatology if area becomes painful or larger for further evalutaion.     No follow-ups on file.  No future appointments.  Matt Holmes, PA

## 2019-11-25 DIAGNOSIS — Z01812 Encounter for preprocedural laboratory examination: Secondary | ICD-10-CM | POA: Diagnosis not present

## 2019-11-25 DIAGNOSIS — Z20822 Contact with and (suspected) exposure to covid-19: Secondary | ICD-10-CM | POA: Diagnosis not present

## 2019-11-27 DIAGNOSIS — Z539 Procedure and treatment not carried out, unspecified reason: Secondary | ICD-10-CM | POA: Diagnosis not present

## 2019-11-27 DIAGNOSIS — H7191 Unspecified cholesteatoma, right ear: Secondary | ICD-10-CM | POA: Diagnosis not present

## 2019-12-27 DIAGNOSIS — Z6822 Body mass index (BMI) 22.0-22.9, adult: Secondary | ICD-10-CM | POA: Diagnosis not present

## 2019-12-27 DIAGNOSIS — H7191 Unspecified cholesteatoma, right ear: Secondary | ICD-10-CM | POA: Diagnosis not present

## 2020-01-06 NOTE — Progress Notes (Signed)
Established patient visit   Patient: Erik Rodriguez   DOB: 12/06/1970   49 y.o. Male  MRN: [id number] Visit Date: 01/07/2020  Today's healthcare provider: Lelon Huh, MD   Chief Complaint  Patient presents with  . Hypertension   Subjective    HPI Hypertension, follow-up  BP Readings from Last 3 Encounters:  01/07/20 (!) 164/94  02/14/18 (!) 154/98  01/05/18 (!) 142/98   Wt Readings from Last 3 Encounters:  01/07/20 150 lb (68 kg)  02/14/18 146 lb 6.4 oz (66.4 kg)  01/05/18 137 lb (62.1 kg)     He was last seen for hypertension 02/14/2018  BP at that visit was 154/98. Management since that visit includes no change.  He reports poor compliance with treatment.  Patient has not taken his blood pressure medication in a year.  He states he was scheduled to have surgery for a tumor on his ear in May.  However the surgery got cancelled due to the blood pressure being so high.  He reports the readings for that day were; 221/137, 199/117, 186/105.  Patient was instructed to be seen to get his pressure down and then he is to see the surgeon in August. He is following a Regular diet. He is not exercising. But states he stays very active He does smoke.  Use of agents associated with hypertension: none.   Outside blood pressures are not taken by patient.  Readings on day of surgery are as above.. Symptoms: No chest pain No chest pressure  No palpitations YES syncope  No dyspnea No orthopnea  No paroxysmal nocturnal dyspnea No lower extremity edema   Pertinent labs: Lab Results  Component Value Date   CHOL 180 12/04/2017   HDL 89 12/04/2017   LDLCALC 75 12/04/2017   TRIG 79 12/04/2017   CHOLHDL 2.0 12/04/2017   Lab Results  Component Value Date   NA 139 01/05/2018   K 3.7 01/05/2018   CREATININE 1.06 01/05/2018   GFRNONAA 83 01/05/2018   GFRAA 96 01/05/2018   GLUCOSE 81 01/05/2018     The 10-year ASCVD risk score Mikey Bussing DC Jr., et al., 2013) is: 17.9%    ---------------------------------------------------------------------------------------------------      Medications: Outpatient Medications Prior to Visit  Medication Sig  . hydrochlorothiazide (HYDRODIURIL) 25 MG tablet Take 1 tablet (25 mg total) by mouth daily. (Patient not taking: Reported on 01/07/2020)  . lisinopril (PRINIVIL,ZESTRIL) 20 MG tablet Take 1 tablet (20 mg total) by mouth daily. (Patient not taking: Reported on 01/07/2020)   No facility-administered medications prior to visit.    Review of Systems  Respiratory: Negative for cough and shortness of breath.   Cardiovascular: Negative for chest pain, palpitations and leg swelling.  Neurological: Positive for dizziness (a few months ago) and syncope (3 days ago). Negative for headaches.      Objective    BP (!) 164/94 (BP Location: Left Arm, Patient Position: Sitting, Cuff Size: Normal)   Pulse 70   Temp (!) 96.8 F (36 C) (Skin)   Wt 150 lb (68 kg)   SpO2 98%   BMI 22.15 kg/m    Physical Exam  General appearance: Well developed, well nourished male, cooperative and in no acute distress Head: Normocephalic, without obvious abnormality, atraumatic Respiratory: Respirations even and unlabored, normal respiratory rate Extremities: All extremities are intact.  Skin: Skin color, texture, turgor normal. No rashes seen  Psych: Appropriate mood and affect. Neurologic: Mental status: Alert, oriented to person, place, and time,  thought content appropriate.   EKG:   Assessment & Plan     1. Essential hypertension Uncontrolled since being off of medications for the last year. Restart at 1/2 previous dose and titrate up as below.  - hydrochlorothiazide (HYDRODIURIL) 25 MG tablet; Take 1 tablet (25 mg total) by mouth daily. Take 1/2 tablet daily for 8 days, then increase to 1 tablet daily  Dispense: 30 tablet; Refill: 2 - lisinopril (ZESTRIL) 20 MG tablet; Take 1/2 tablet daily for 8 days, then increase to 1 tablet  daily  Dispense: 30 tablet; Refill: 1 - CBC - Comprehensive metabolic panel  2. Syncope, unspecified syncope type Likely secondary very high blood pressure. No indication of any other cardiac disease.  - EKG 12-Lead   Return in about 1 month (around 02/06/2020).      The entirety of the information documented in the History of Present Illness, Review of Systems and Physical Exam were personally obtained by me. Portions of this information were initially documented by the CMA and reviewed by me for thoroughness and accuracy.      Mila Merry, MD  [facility] [phone] (phone) [phone] (fax)  [facility]

## 2020-01-07 ENCOUNTER — Ambulatory Visit (INDEPENDENT_AMBULATORY_CARE_PROVIDER_SITE_OTHER): Payer: BC Managed Care – PPO | Admitting: Family Medicine

## 2020-01-07 ENCOUNTER — Other Ambulatory Visit: Payer: Self-pay

## 2020-01-07 VITALS — BP 164/94 | HR 70 | Temp 96.8°F | Wt 150.0 lb

## 2020-01-07 DIAGNOSIS — R55 Syncope and collapse: Secondary | ICD-10-CM | POA: Diagnosis not present

## 2020-01-07 DIAGNOSIS — I10 Essential (primary) hypertension: Secondary | ICD-10-CM | POA: Diagnosis not present

## 2020-01-07 DIAGNOSIS — D649 Anemia, unspecified: Secondary | ICD-10-CM | POA: Diagnosis not present

## 2020-01-07 MED ORDER — LISINOPRIL 20 MG PO TABS: ORAL_TABLET | ORAL | 1 refills | Status: DC

## 2020-01-07 MED ORDER — HYDROCHLOROTHIAZIDE 25 MG PO TABS: 25.0000 mg | ORAL_TABLET | Freq: Every day | ORAL | 2 refills | Status: DC

## 2020-01-07 NOTE — Patient Instructions (Signed)
.   Please review the attached list of medications and notify my office if there are any errors.   . Please bring all of your medications to every appointment so we can make sure that our medication list is the same as yours.   

## 2020-01-08 LAB — COMPREHENSIVE METABOLIC PANEL
ALT: 17 IU/L (ref 0–44)
AST: 25 IU/L (ref 0–40)
Albumin/Globulin Ratio: 1.8 (ref 1.2–2.2)
Albumin: 4.2 g/dL (ref 4.0–5.0)
Alkaline Phosphatase: 52 IU/L (ref 48–121)
BUN/Creatinine Ratio: 14 (ref 9–20)
BUN: 18 mg/dL (ref 6–24)
Bilirubin Total: <0.2 mg/dL (ref 0.0–1.2)
CO2: 27 mmol/L (ref 20–29)
Calcium: 9.2 mg/dL (ref 8.7–10.2)
Chloride: 105 mmol/L (ref 96–106)
Creatinine, Ser: 1.32 mg/dL — ABNORMAL HIGH (ref 0.76–1.27)
GFR calc Af Amer: 73 mL/min/{1.73_m2} (ref >59)
GFR calc non Af Amer: 63 mL/min/{1.73_m2} (ref >59)
Globulin, Total: 2.3 g/dL (ref 1.5–4.5)
Glucose: 137 mg/dL — ABNORMAL HIGH (ref 65–99)
Potassium: 3.8 mmol/L (ref 3.5–5.2)
Sodium: 144 mmol/L (ref 134–144)
Total Protein: 6.5 g/dL (ref 6.0–8.5)

## 2020-01-08 LAB — CBC
Hematocrit: 35.8 % — ABNORMAL LOW (ref 37.5–51.0)
Hemoglobin: 11.9 g/dL — ABNORMAL LOW (ref 13.0–17.7)
MCH: 31.6 pg (ref 26.6–33.0)
MCHC: 33.2 g/dL (ref 31.5–35.7)
MCV: 95 fL (ref 79–97)
Platelets: 229 10*3/uL (ref 150–450)
RBC: 3.77 x10E6/uL — ABNORMAL LOW (ref 4.14–5.80)
RDW: 12.6 % (ref 11.6–15.4)
WBC: 7.5 10*3/uL (ref 3.4–10.8)

## 2020-01-09 LAB — IRON AND TIBC
Iron Saturation: 27 % (ref 15–55)
Iron: 68 ug/dL (ref 38–169)
Total Iron Binding Capacity: 249 ug/dL — ABNORMAL LOW (ref 250–450)
UIBC: 181 ug/dL (ref 111–343)

## 2020-01-09 LAB — SPECIMEN STATUS REPORT

## 2020-01-09 LAB — FERRITIN: Ferritin: 80 ng/mL (ref 30–400)

## 2020-02-14 NOTE — Progress Notes (Signed)
I,Roshena L Chambers,acting as a scribe for Mila Merry, MD.,have documented all relevant documentation on the behalf of Mila Merry, MD,as directed by  Mila Merry, MD while in the presence of Mila Merry, MD.  Established patient visit   Patient: Erik Rodriguez   DOB: 31-Jan-1971   49 y.o. Male  MRN: [id number] Visit Date: 02/17/2020  Today's healthcare provider: Mila Merry, MD   Chief Complaint  Patient presents with  . Hypertension   Subjective    HPI  Hypertension, follow-up  BP Readings from Last 3 Encounters:  02/17/20 (!) 138/88  01/07/20 (!) 164/94  02/14/18 (!) 154/98   Wt Readings from Last 3 Encounters:  02/17/20 143 lb (64.9 kg)  01/07/20 150 lb (68 kg)  02/14/18 146 lb 6.4 oz (66.4 kg)     He was last seen for hypertension 01/07/2020.  BP at that visit was 164/94. Management since that visit includes starting Lisinopril and HCTZ (patient had been off medications for the last year).  He reports good compliance with treatment. He is not having side effects.  He is following a Regular diet. He is not exercising. He does not smoke.  Use of agents associated with hypertension: none.   Outside blood pressures are not checked. Symptoms: No chest pain No chest pressure  No palpitations No syncope  No dyspnea No orthopnea  No paroxysmal nocturnal dyspnea No lower extremity edema   Pertinent labs: Lab Results  Component Value Date   CHOL 180 12/04/2017   HDL 89 12/04/2017   LDLCALC 75 12/04/2017   TRIG 79 12/04/2017   CHOLHDL 2.0 12/04/2017   Lab Results  Component Value Date   NA 144 01/07/2020   K 3.8 01/07/2020   CREATININE 1.32 (H) 01/07/2020   GFRNONAA 63 01/07/2020   GFRAA 73 01/07/2020   GLUCOSE 137 (H) 01/07/2020     He was also noted to be mildly anemic with Hgb of 11.9 on 01/07/2020. Iron studies were normal.   The 10-year ASCVD risk score Denman George DC Jr., et al., 2013) is: 7.9%    ---------------------------------------------------------------------------------------------------     Medications: Outpatient Medications Prior to Visit  Medication Sig  . hydrochlorothiazide (HYDRODIURIL) 25 MG tablet Take 1 tablet (25 mg total) by mouth daily. Take 1/2 tablet daily for 8 days, then increase to 1 tablet daily  . lisinopril (ZESTRIL) 20 MG tablet Take 1/2 tablet daily for 8 days, then increase to 1 tablet daily   No facility-administered medications prior to visit.    Review of Systems  Constitutional: Negative for appetite change, chills and fever.  Respiratory: Negative for chest tightness, shortness of breath and wheezing.   Cardiovascular: Negative for chest pain and palpitations.  Gastrointestinal: Negative for abdominal pain, nausea and vomiting.     Objective    BP (!) 138/88 (BP Location: Right Arm, Cuff Size: Normal)   Pulse 69   Temp 98.2 F (36.8 C) (Oral)   Resp 16   Ht 5\' 7"  (1.702 m)   Wt 143 lb (64.9 kg)   SpO2 99% Comment: room air  BMI 22.40 kg/m   Physical Exam  General appearance: Well developed, well nourished male, cooperative and in no acute distress Head: Normocephalic, without obvious abnormality, atraumatic Respiratory: Respirations even and unlabored, normal respiratory rate   No results found for any visits on 02/17/20.  Assessment & Plan     1. Essential hypertension Much better since starting lisinopril and hctz.  - Renal function panel  2. Hyperglycemia  Encouraged to avoid sweets and white starch foods in diet.   3. Anemia, unspecified type  - CBC   No follow-ups on file.      The entirety of the information documented in the History of Present Illness, Review of Systems and Physical Exam were personally obtained by me. Portions of this information were initially documented by the CMA and reviewed by me for thoroughness and accuracy.      Mila Merry, MD  [facility] [phone]  (phone) [phone] (fax)  [facility]

## 2020-02-17 ENCOUNTER — Ambulatory Visit (INDEPENDENT_AMBULATORY_CARE_PROVIDER_SITE_OTHER): Payer: BC Managed Care – PPO | Admitting: Family Medicine

## 2020-02-17 ENCOUNTER — Encounter: Payer: Self-pay | Admitting: Family Medicine

## 2020-02-17 ENCOUNTER — Other Ambulatory Visit: Payer: Self-pay

## 2020-02-17 VITALS — BP 138/88 | HR 69 | Temp 98.2°F | Resp 16 | Ht 67.0 in | Wt 143.0 lb

## 2020-02-17 DIAGNOSIS — R739 Hyperglycemia, unspecified: Secondary | ICD-10-CM | POA: Diagnosis not present

## 2020-02-17 DIAGNOSIS — I10 Essential (primary) hypertension: Secondary | ICD-10-CM

## 2020-02-17 DIAGNOSIS — D649 Anemia, unspecified: Secondary | ICD-10-CM

## 2020-02-17 NOTE — Patient Instructions (Addendum)
.   Avoid sweets entirely and avoid white starchy foods

## 2020-02-18 LAB — RENAL FUNCTION PANEL
Albumin: 4.5 g/dL (ref 4.0–5.0)
BUN/Creatinine Ratio: 14 (ref 9–20)
BUN: 15 mg/dL (ref 6–24)
CO2: 26 mmol/L (ref 20–29)
Calcium: 9.5 mg/dL (ref 8.7–10.2)
Chloride: 100 mmol/L (ref 96–106)
Creatinine, Ser: 1.1 mg/dL (ref 0.76–1.27)
GFR calc Af Amer: 91 mL/min/{1.73_m2} (ref >59)
GFR calc non Af Amer: 78 mL/min/{1.73_m2} (ref >59)
Glucose: 82 mg/dL (ref 65–99)
Phosphorus: 3 mg/dL (ref 2.8–4.1)
Potassium: 3.8 mmol/L (ref 3.5–5.2)
Sodium: 138 mmol/L (ref 134–144)

## 2020-02-18 LAB — CBC
Hematocrit: 36 % — ABNORMAL LOW (ref 37.5–51.0)
Hemoglobin: 12.2 g/dL — ABNORMAL LOW (ref 13.0–17.7)
MCH: 31.4 pg (ref 26.6–33.0)
MCHC: 33.9 g/dL (ref 31.5–35.7)
MCV: 93 fL (ref 79–97)
Platelets: 294 10*3/uL (ref 150–450)
RBC: 3.88 x10E6/uL — ABNORMAL LOW (ref 4.14–5.80)
RDW: 12.3 % (ref 11.6–15.4)
WBC: 9.1 10*3/uL (ref 3.4–10.8)

## 2020-02-21 DIAGNOSIS — Z6822 Body mass index (BMI) 22.0-22.9, adult: Secondary | ICD-10-CM | POA: Diagnosis not present

## 2020-02-21 DIAGNOSIS — H7191 Unspecified cholesteatoma, right ear: Secondary | ICD-10-CM | POA: Diagnosis not present

## 2020-04-09 ENCOUNTER — Other Ambulatory Visit: Payer: Self-pay

## 2020-04-09 DIAGNOSIS — I10 Essential (primary) hypertension: Secondary | ICD-10-CM

## 2020-04-09 MED ORDER — HYDROCHLOROTHIAZIDE 25 MG PO TABS: 25.0000 mg | ORAL_TABLET | Freq: Every day | ORAL | 2 refills | Status: DC

## 2020-04-09 MED ORDER — LISINOPRIL 20 MG PO TABS: ORAL_TABLET | ORAL | 1 refills | Status: DC

## 2020-04-09 NOTE — Telephone Encounter (Signed)
Patient needs refills on Lisinopril 25 mg. And Hydrocholorithiazide 25 mg. Sent to [facility] on [address]

## 2020-06-22 DIAGNOSIS — Z01812 Encounter for preprocedural laboratory examination: Secondary | ICD-10-CM | POA: Diagnosis not present

## 2020-06-22 DIAGNOSIS — Z20822 Contact with and (suspected) exposure to covid-19: Secondary | ICD-10-CM | POA: Diagnosis not present

## 2020-06-23 ENCOUNTER — Other Ambulatory Visit: Payer: Self-pay

## 2020-06-23 ENCOUNTER — Ambulatory Visit (INDEPENDENT_AMBULATORY_CARE_PROVIDER_SITE_OTHER): Payer: BC Managed Care – PPO | Admitting: Family Medicine

## 2020-06-23 ENCOUNTER — Encounter: Payer: Self-pay | Admitting: Family Medicine

## 2020-06-23 VITALS — BP 159/101 | HR 80 | Temp 98.4°F | Resp 16 | Wt 149.0 lb

## 2020-06-23 DIAGNOSIS — I10 Essential (primary) hypertension: Secondary | ICD-10-CM

## 2020-06-23 NOTE — Progress Notes (Signed)
Established patient visit   Patient: Erik Rodriguez   DOB: [birth year]   49 y.o. Male  MRN: [id number] Visit Date: 06/23/2020  Today's healthcare provider: Mila Merry, MD   Chief Complaint  Patient presents with  . Hypertension   Subjective    HPI  Hypertension, follow-up  BP Readings from Last 3 Encounters:  06/23/20 (!) 159/101  02/17/20 (!) 138/88  01/07/20 (!) 164/94   Wt Readings from Last 3 Encounters:  06/23/20 149 lb (67.6 kg)  02/17/20 143 lb (64.9 kg)  01/07/20 150 lb (68 kg)     He was last seen for hypertension 3 months ago.  BP at that visit was 138/88. Management since that visit includes continue same medication.  He reports good compliance with treatment. He is not having side effects.  He is following a Regular diet. He is not exercising. He does not smoke.  Use of agents associated with hypertension: none.   Outside blood pressures are not checked. Symptoms: No chest pain No chest pressure  No palpitations No syncope  No dyspnea No orthopnea  No paroxysmal nocturnal dyspnea No lower extremity edema   Pertinent labs: Lab Results  Component Value Date   CHOL 180 12/04/2017   HDL 89 12/04/2017   LDLCALC 75 12/04/2017   TRIG 79 12/04/2017   CHOLHDL 2.0 12/04/2017   Lab Results  Component Value Date   NA 138 02/17/2020   K 3.8 02/17/2020   CREATININE 1.10 02/17/2020   GFRNONAA 78 02/17/2020   GFRAA 91 02/17/2020   GLUCOSE 82 02/17/2020     The 10-year ASCVD risk score Denman George DC Jr., et al., 2013) is: 10.2%   He is scheduled for tympanoplasty tomorrow. He states he was told to stop all his blood pressure medications 24 hours before his surgery, but has not taken them since the evening of 06-21-2020. He feels fine.  ---------------------------------------------------------------------------------------------------     Medications: Outpatient Medications Prior to Visit  Medication Sig  . hydrochlorothiazide (HYDRODIURIL)  25 MG tablet Take 1 tablet (25 mg total) by mouth daily. Take 1/2 tablet daily for 8 days, then increase to 1 tablet daily  . lisinopril (ZESTRIL) 20 MG tablet Take 1/2 tablet daily for 8 days, then increase to 1 tablet daily   No facility-administered medications prior to visit.    Review of Systems  Constitutional: Negative for appetite change, chills and fever.  Respiratory: Negative for chest tightness, shortness of breath and wheezing.   Cardiovascular: Negative for chest pain and palpitations.  Gastrointestinal: Negative for abdominal pain, nausea and vomiting.      Objective    BP (!) 159/101 (BP Location: Left Arm, Patient Position: Sitting, Cuff Size: Normal)   Pulse 80   Temp 98.4 F (36.9 C) (Oral)   Resp 16   Wt 149 lb (67.6 kg)   BMI 23.34 kg/m    Physical Exam    General: Appearance:    Well developed, well nourished male in no acute distress  Eyes:    PERRL, conjunctiva/corneas clear, EOM's intact       Lungs:     Clear to auscultation bilaterally, respirations unlabored  Heart:    Normal heart rate. Normal rhythm. No murmurs, rubs, or gallops.   MS:   All extremities are intact.   Neurologic:   Awake, alert, oriented x 3. No apparent focal neurological           defect.         Assessment &  Plan     1. Primary hypertension Had been well controlled at his last visit, but now has not taken his medications for the last 36 hours due to anticipated surgery tomorrow. He is at risk for hypertensive urgency and/or cancellation of surgery if his blood pressure continues to rise. Advised to go ahead and take 1/2 dose of each medication today and resume previously dose after his surgery.   He declined flu vaccines       Mila Merry, MD  [facility] [place] [phone] (phone) [phone] (fax)  [facility]

## 2020-06-24 DIAGNOSIS — H7191 Unspecified cholesteatoma, right ear: Secondary | ICD-10-CM | POA: Diagnosis not present

## 2020-06-24 DIAGNOSIS — I10 Essential (primary) hypertension: Secondary | ICD-10-CM | POA: Diagnosis not present

## 2020-06-24 DIAGNOSIS — H7121 Cholesteatoma of mastoid, right ear: Secondary | ICD-10-CM | POA: Diagnosis not present

## 2020-06-30 ENCOUNTER — Other Ambulatory Visit: Payer: Self-pay | Admitting: Family Medicine

## 2020-06-30 DIAGNOSIS — I10 Essential (primary) hypertension: Secondary | ICD-10-CM

## 2020-06-30 MED ORDER — LISINOPRIL 20 MG PO TABS: ORAL_TABLET | ORAL | 2 refills | Status: DC

## 2020-06-30 MED ORDER — HYDROCHLOROTHIAZIDE 25 MG PO TABS: 25.0000 mg | ORAL_TABLET | Freq: Every day | ORAL | 2 refills | Status: DC

## 2020-09-04 DIAGNOSIS — H6121 Impacted cerumen, right ear: Secondary | ICD-10-CM | POA: Diagnosis not present

## 2021-09-29 ENCOUNTER — Other Ambulatory Visit: Payer: Self-pay

## 2021-09-29 ENCOUNTER — Emergency Department
Admission: EM | Admit: 2021-09-29 | Discharge: 2021-09-29 | Disposition: A | Discharge status: Home / Self Care | Payer: 59 | Attending: Emergency Medicine | Admitting: Emergency Medicine

## 2021-09-29 ENCOUNTER — Encounter: Payer: Self-pay | Admitting: Intensive Care

## 2021-09-29 ENCOUNTER — Emergency Department: Payer: 59

## 2021-09-29 DIAGNOSIS — I10 Essential (primary) hypertension: Secondary | ICD-10-CM | POA: Insufficient documentation

## 2021-09-29 DIAGNOSIS — S61216A Laceration without foreign body of right little finger without damage to nail, initial encounter: Secondary | ICD-10-CM | POA: Insufficient documentation

## 2021-09-29 DIAGNOSIS — S6990XA Unspecified injury of unspecified wrist, hand and finger(s), initial encounter: Secondary | ICD-10-CM

## 2021-09-29 DIAGNOSIS — Z23 Encounter for immunization: Secondary | ICD-10-CM | POA: Diagnosis not present

## 2021-09-29 DIAGNOSIS — W240XXA Contact with lifting devices, not elsewhere classified, initial encounter: Secondary | ICD-10-CM | POA: Insufficient documentation

## 2021-09-29 HISTORY — DX: Essential (primary) hypertension: I10

## 2021-09-29 MED ORDER — CEPHALEXIN 500 MG PO CAPS
500.0000 mg | ORAL_CAPSULE | Freq: Two times a day (BID) | ORAL | 0 refills | Status: AC
Start: 2021-09-29 — End: 2021-10-04

## 2021-09-29 MED ORDER — MICROFIBRILLAR COLL HEMOSTAT EX POWD
1.0000 g | Freq: Once | CUTANEOUS | Status: AC
Start: 2021-09-29 — End: 2021-09-29
  Administered 2021-09-29: 1 g via TOPICAL

## 2021-09-29 MED ORDER — TETANUS-DIPHTH-ACELL PERTUSSIS 5-2.5-18.5 LF-MCG/0.5 IM SUSY
0.5000 mL | PREFILLED_SYRINGE | Freq: Once | INTRAMUSCULAR | Status: AC
Start: 2021-09-29 — End: 2021-09-29
  Administered 2021-09-29: 0.5 mL via INTRAMUSCULAR
  Filled 2021-09-29: qty 0.5

## 2021-09-29 NOTE — ED Provider Notes (Signed)
? ?[facility] ?Provider Note ? ? ? Event Date/Time  ? First MD Initiated Contact with Patient 09/29/21 1259   ?  (approximate) ? ? ?History  ? ?Extremity Laceration ? ? ?HPI ? ?Treshaun Beavin is a 51 y.o. male who comes in with right hand left pinky that was cut by metal while on a forklift.  Denies any other injuries, hitting head, losing consciousness.  Patient has a history of hypertension but not taking his blood pressure medicine for a month.  Denies any blood thinners. Last tetanus over 10 years ago.  ? ?Physical Exam  ? ?Triage Vital Signs: ?ED Triage Vitals  ?Enc Vitals Group  ?   BP 09/29/21 1222 (!) 189/140  ?   Pulse Rate 09/29/21 1222 (!) 106  ?   Resp 09/29/21 1222 16  ?   Temp 09/29/21 1222 99 ?F (37.2 ?C)  ?   Temp Source 09/29/21 1222 Oral  ?   SpO2 09/29/21 1222 96 %  ?   Weight 09/29/21 1220 150 lb (68 kg)  ?   Height 09/29/21 1220 5\' 7"  (1.702 m)  ?   Head Circumference --   ?   Peak Flow --   ?   Pain Score 09/29/21 1220 1  ?   Pain Loc --   ?   Pain Edu? --   ?   Excl. in [place]? --   ? ? ?Most recent vital signs: ?Vitals:  ? 09/29/21 1222  ?BP: (!) 189/140  ?Pulse: (!) 106  ?Resp: 16  ?Temp: 99 ?F (37.2 ?C)  ?SpO2: 96%  ? ? ? ?General: Awake, no distress.  ?CV:  Good peripheral perfusion.  ?Resp:  Normal effort.  ?Abd:  No distention.  ?Other:  Contrary to triage note it is his right hand not left hand that has a soft tissue abrasion on the distal part with venous bleeding noted out of it.  No injury to the nailbed or to the nail.  NO subungal hematoma. Still able to have DIP and PIP flexion.  Sensation intact ? ? ?ED Results / Procedures / Treatments  ? ?Labs ?(all labs ordered are listed, but only abnormal results are displayed) ?Labs Reviewed  ?CBC WITH DIFFERENTIAL/PLATELET  ?BASIC METABOLIC PANEL  ? ? ? ? ?RADIOLOGY ?I have reviewed the xray no fracture  ? ?PROCEDURES: ? ?Critical Care performed: No ? ?Marland Kitchen.Laceration Repair ? ?Date/Time: 09/29/2021 2:04 PM ?Performed  by: Vanessa [place], MD ?Authorized by: Vanessa [place], MD  ? ?Consent:  ?  Consent obtained:  Verbal ?  Risks discussed:  Infection ?  Alternatives discussed:  No treatment ?Laceration details:  ?  Location:  Finger ?  Finger location:  R small finger ?  Length (cm):  5 ?Exploration:  ?  Hemostasis achieved with:  Direct pressure ?Post-procedure details:  ?  Dressing:  Adhesive bandage and sterile dressing ?Comments:  ?   Laceration unable to be approximated due to being a abrasion/ removal of superificial skin- not true laceration over the skin with continuous venous bleeding therefore Surgicel was placed and it was wrapped.  Wrapping was taken down and the Surgicel had formed clots and bleeding had subsided.  Then we re- wrapped the hand. ? ? ?MEDICATIONS ORDERED IN ED: ?Medications  ?Tdap (BOOSTRIX) injection 0.5 mL (has no administration in time range)  ? ? ? ?IMPRESSION / MDM / ASSESSMENT AND PLAN / ED COURSE  ?I reviewed the triage vital signs and the nursing  notes. ?             ?               ? ? ?Patient comes in with injury to his right hand.  Patient notably hypertensive but reports not taking his blood pressure medicine for a month.  Recommended getting blood work to make sure he is not in renal failure but patient declines that he has to go pick up his father.  He understands that he could have worsening kidney disease and this may need dialysis but he did not want to have blood work done today.  Is got a follow-up with his PCP.  The wound was unable to be closed with sutures given it was a skin abrasion of the pinky finger.  X-ray was ordered without evidence of fracture, tetanus was updated.  Likely with Surgicel the bleeding had subsided and the wound was redressed and patient is going to follow-up in 2 days for wound check.  Will start on some prophylactic antibiotics. ? ?I reviewed patient's last visit where he had hypertension noted in 2021 by Dr. Caryn Section ? ? ?FINAL CLINICAL IMPRESSION(S) / ED  DIAGNOSES  ? ?Final diagnoses:  ?Laceration of right little finger, foreign body presence unspecified, nail damage status unspecified, initial encounter  ? ? ? ?Rx / DC Orders  ? ?ED Discharge Orders   ? ?      Ordered  ?  cephALEXin (KEFLEX) 500 MG capsule  2 times daily       ? 09/29/21 1408  ? ?  ?  ? ?  ? ? ? ?Note:  This document was prepared using Dragon voice recognition software and may include unintentional dictation errors. ?  ?Vanessa [place], MD ?09/29/21 1409 ? ?

## 2021-09-29 NOTE — ED Notes (Signed)
Avitene has been overridden in the pyxis. Order was placed but medication not highlighted in order to retrieve. Pharmacy tech Delfino Lovett attempted to retrieve medication with no success.  ?

## 2021-09-29 NOTE — Discharge Instructions (Signed)
Recommended blood work given your significantly elevated blood pressure but you declined.  Follow-up for wound check in 2 days.  We will start some antibiotics to help prevent infection.  Return to the ER if it starts to rebleed or any other concerns ?

## 2021-09-29 NOTE — ED Triage Notes (Addendum)
Patients right hand, left pinky was cut by metal. Patient denies workmans comp. Finger continuously bleeding. Hx HTN and has not taken blood pressure medication X1 month ?

## 2022-03-14 NOTE — Progress Notes (Signed)
I,Roshena L Chambers,acting as a scribe for Mila Merry, MD.,have documented all relevant documentation on the behalf of Mila Merry, MD,as directed by  Mila Merry, MD while in the presence of Mila Merry, MD.   Established patient visit   Patient: Erik Rodriguez   DOB: 1970/08/26   51 y.o. Male  MRN: [id number] Visit Date: 03/15/2022  Today's healthcare provider: Mila Merry, MD   Chief Complaint  Patient presents with   Hypertension   Erectile Dysfunction   Subjective    HPI  Hypertension, follow-up  BP Readings from Last 3 Encounters:  03/15/22 (!) 180/125  09/29/21 (!) 189/140  06/23/20 (!) 159/101   Wt Readings from Last 3 Encounters:  03/15/22 145 lb (65.8 kg)  09/29/21 150 lb (68 kg)  06/23/20 149 lb (67.6 kg)     He was last seen for hypertension on 06/23/2020.   BP at that visit was 159/101. Management since that visit includes advising patient resume taking medications as prescribed.  He reports fair compliance with treatment. He has not had prescriptions refilled since 2022 and states he is taking them every 2-3 days to stretch out prescription, although he states he did take medications this morning.  He is not having side effects.  He is following a Regular diet. He is not exercising. He does not smoke.  Use of agents associated with hypertension: none.   Outside blood pressures are not checked. Symptoms: No chest pain No chest pressure  No palpitations No syncope  No dyspnea No orthopnea  No paroxysmal nocturnal dyspnea No lower extremity edema   Pertinent labs Lab Results  Component Value Date   CHOL 180 12/04/2017   HDL 89 12/04/2017   LDLCALC 75 12/04/2017   TRIG 79 12/04/2017   CHOLHDL 2.0 12/04/2017   Lab Results  Component Value Date   NA 138 02/17/2020   K 3.8 02/17/2020   CREATININE 1.10 02/17/2020   GFRNONAA 78 02/17/2020   GLUCOSE 82 02/17/2020     The ASCVD Risk score (Arnett DK, et al., 2019) failed to  calculate for the following reasons:   Cannot find a previous HDL lab   Cannot find a previous total cholesterol lab  ---------------------------------------------------------------------------------------------------   ED: Patient would like to discuss starting medication to help with erectile dysfunction. Started having trouble with erections about a month ago.   Medications: Outpatient Medications Prior to Visit  Medication Sig   hydrochlorothiazide (HYDRODIURIL) 25 MG tablet Take 1 tablet (25 mg total) by mouth daily.   lisinopril (ZESTRIL) 20 MG tablet Take 1/2 tablet daily for 8 days, then increase to 1 tablet daily   No facility-administered medications prior to visit.    Review of Systems  Constitutional:  Negative for appetite change, chills and fever.  Respiratory:  Negative for chest tightness, shortness of breath and wheezing.   Cardiovascular:  Negative for chest pain and palpitations.  Gastrointestinal:  Negative for abdominal pain, nausea and vomiting.       Objective    BP (!) 180/125 (BP Location: Left Arm, Patient Position: Sitting, Cuff Size: Normal)   Pulse 62   Temp 98.5 F (36.9 C) (Oral)   Wt 145 lb (65.8 kg)   SpO2 99% Comment: room air  BMI 22.71 kg/m   Today's Vitals   03/15/22 0824 03/15/22 0833  BP: (!) 197/126 (!) 180/125  Pulse: 62 62  Temp: 98.5 F (36.9 C)   TempSrc: Oral   SpO2: 99%   Weight:  145 lb (65.8 kg)    Body mass index is 22.71 kg/m.    Physical Exam    General: Appearance:    Well developed, well nourished male in no acute distress  Eyes:    PERRL, conjunctiva/corneas clear, EOM's intact       Lungs:     Clear to auscultation bilaterally, respirations unlabored  Heart:    Normal heart rate. Normal rhythm. No murmurs, rubs, or gallops.    MS:   All extremities are intact.    Neurologic:   Awake, alert, oriented x 3. No apparent focal neurological defect.         Assessment & Plan     1. Primary  hypertension Uncontrolled. Not taking medications consistently due to supply running low.  - Renal function panel  Refill hydrochlorothiazide (HYDRODIURIL) 25 MG tablet; Take 1 tablet (25 mg total) by mouth daily.  Dispense: 30 tablet; Refill: 0 and lisinopril (ZESTRIL) 20 MG tablet; Take 1 tablet (20 mg total) by mouth daily.  Dispense: 30 tablet; Refill: 0  Will need to close follow up to ensure BP controlled when taking medications consistently.   2. Erectile dysfunction, unspecified erectile dysfunction type  - Testosterone,Free and Total  He would like ED medications if testosterone levels normal.   3. Prostate cancer screening  - PSA Total (Reflex To Free) ([facility] only)  4. Colon cancer screening BP will need to be controlled before scheduling colonoscopy.        The entirety of the information documented in the History of Present Illness, Review of Systems and Physical Exam were personally obtained by me. Portions of this information were initially documented by the CMA and reviewed by me for thoroughness and accuracy.     Mila Merry, MD  [facility] [phone] (phone) [phone] (fax)  [facility]

## 2022-03-15 ENCOUNTER — Ambulatory Visit (INDEPENDENT_AMBULATORY_CARE_PROVIDER_SITE_OTHER): Payer: 59 | Admitting: Family Medicine

## 2022-03-15 ENCOUNTER — Encounter: Payer: Self-pay | Admitting: Family Medicine

## 2022-03-15 VITALS — BP 180/125 | HR 62 | Temp 98.5°F | Wt 145.0 lb

## 2022-03-15 DIAGNOSIS — Z1211 Encounter for screening for malignant neoplasm of colon: Secondary | ICD-10-CM | POA: Diagnosis not present

## 2022-03-15 DIAGNOSIS — Z125 Encounter for screening for malignant neoplasm of prostate: Secondary | ICD-10-CM | POA: Diagnosis not present

## 2022-03-15 DIAGNOSIS — I10 Essential (primary) hypertension: Secondary | ICD-10-CM | POA: Diagnosis not present

## 2022-03-15 DIAGNOSIS — N529 Male erectile dysfunction, unspecified: Secondary | ICD-10-CM

## 2022-03-15 MED ORDER — LISINOPRIL 20 MG PO TABS: 20.0000 mg | ORAL_TABLET | Freq: Every day | ORAL | 0 refills | Status: DC

## 2022-03-15 MED ORDER — LISINOPRIL 20 MG PO TABS: 20.0000 mg | ORAL_TABLET | Freq: Every day | ORAL | 0 refills | Status: AC

## 2022-03-15 MED ORDER — HYDROCHLOROTHIAZIDE 25 MG PO TABS: 25.0000 mg | ORAL_TABLET | Freq: Every day | ORAL | 0 refills | Status: AC

## 2022-03-23 LAB — RENAL FUNCTION PANEL
Albumin: 4.9 g/dL (ref 3.8–4.9)
BUN/Creatinine Ratio: 15 (ref 9–20)
BUN: 17 mg/dL (ref 6–24)
CO2: 21 mmol/L (ref 20–29)
Calcium: 9.5 mg/dL (ref 8.7–10.2)
Chloride: 103 mmol/L (ref 96–106)
Creatinine, Ser: 1.14 mg/dL (ref 0.76–1.27)
Glucose: 87 mg/dL (ref 70–99)
Phosphorus: 3.4 mg/dL (ref 2.8–4.1)
Potassium: 4.3 mmol/L (ref 3.5–5.2)
Sodium: 143 mmol/L (ref 134–144)
eGFR: 78 mL/min/{1.73_m2} (ref >59)

## 2022-03-23 LAB — TESTOSTERONE,FREE AND TOTAL
Testosterone, Free: 10.2 pg/mL (ref 7.2–24.0)
Testosterone: 750 ng/dL (ref 264–916)

## 2022-07-26 ENCOUNTER — Ambulatory Visit: Payer: 59 | Admitting: Family Medicine

## 2022-07-26 ENCOUNTER — Ambulatory Visit: Payer: 59

## 2022-07-26 ENCOUNTER — Encounter: Payer: Self-pay | Admitting: Family Medicine

## 2022-07-26 DIAGNOSIS — A539 Syphilis, unspecified: Secondary | ICD-10-CM

## 2022-07-26 DIAGNOSIS — Z113 Encounter for screening for infections with a predominantly sexual mode of transmission: Secondary | ICD-10-CM

## 2022-07-26 DIAGNOSIS — Z8619 Personal history of other infectious and parasitic diseases: Secondary | ICD-10-CM

## 2022-07-26 LAB — HM HIV SCREENING LAB: HM HIV Screening: NEGATIVE

## 2022-07-26 LAB — GRAM STAIN

## 2022-07-26 MED ORDER — PENICILLIN G BENZATHINE 1200000 UNIT/2ML IM SUSY
2.4000 10*6.[IU] | PREFILLED_SYRINGE | Freq: Once | INTRAMUSCULAR | Status: AC
  Administered 2022-07-26: 2.4 10*6.[IU] via INTRAMUSCULAR

## 2022-07-26 NOTE — Progress Notes (Signed)
[facility] Department STI clinic/screening visit  Subjective:  Erik Rodriguez is a 52 y.o. male being seen today for an STI screening visit. The patient reports they do have symptoms.    Patient has the following medical conditions:   Patient Active Problem List   Diagnosis Date Noted   Erectile dysfunction 03/15/2022   Hypertension 12/04/2017   Cholesteatoma 11/24/2014     Chief Complaint  Patient presents with   SEXUALLY TRANSMITTED DISEASE    Screening- patient is complaining of red dots on chest and stomach area and some burning when urinating and some discharge.      HPI  Patient reports to clinic for STI testing. History of gonorrhea and syphilis. Had a new partner about 1 month ago.  Then lesion about a week after being with partner on genitals. Then noticed red marks and rash on chest and back.  Last HIV test per patient/review of record was  Lab Results  Component Value Date   HMHIVSCREEN Negative - Validated 05/27/2019    Lab Results  Component Value Date   HIV Non Reactive 12/04/2017    Does the patient or their partner desires a pregnancy in the next year? No  Screening for MPX risk: Does the patient have an unexplained rash? No Is the patient MSM? No Does the patient endorse multiple sex partners or anonymous sex partners? No Did the patient have close or sexual contact with a person diagnosed with MPX? No Has the patient traveled outside the [place] where MPX is endemic? No Is there a high clinical suspicion for MPX-- evidenced by one of the following No  -Unlikely to be chickenpox  -Lymphadenopathy  -Rash that present in same phase of evolution on any given body part   See flowsheet for further details and programmatic requirements.   Immunization History  Administered Date(s) Administered   Td 12/04/2017   Tdap 09/29/2021     The following portions of the patient's history were reviewed and updated as appropriate: allergies,  current medications, past medical history, past social history, past surgical history and problem list.  Objective:  There were no vitals filed for this visit.  Physical Exam Constitutional:      Appearance: Normal appearance.  HENT:     Head: Normocephalic and atraumatic.     Comments: No nits or hair loss    Mouth/Throat:     Mouth: Mucous membranes are moist. No oral lesions.     Pharynx: Oropharynx is clear. No oropharyngeal exudate or posterior oropharyngeal erythema.  Eyes:     General:        Right eye: No discharge.        Left eye: No discharge.     Conjunctiva/sclera:     Right eye: Right conjunctiva is not injected. No exudate.    Left eye: Left conjunctiva is not injected. No exudate. Pulmonary:     Effort: Pulmonary effort is normal.  Abdominal:     General: Abdomen is flat.     Palpations: Abdomen is soft. There is no hepatomegaly or mass.     Tenderness: There is no abdominal tenderness. There is no rebound.     Hernia: There is no hernia in the left inguinal area or right inguinal area.  Genitourinary:    Pubic Area: No rash or pubic lice (no nits).      Penis: Normal and uncircumcised. No tenderness, discharge, swelling or lesions.      Testes: Normal.     Epididymis:  Right: Normal. No mass or tenderness.     Left: Normal. No mass or tenderness.     Tanner stage (genital): 5.     Rectum: Normal. No tenderness (no lesions or discharge).       Comments: Penile Discharge Amount: none Color:  none  Healed flat lesion as indicated by mark on pictogram Lymphadenopathy:     Head:     Right side of head: No preauricular or posterior auricular adenopathy.     Left side of head: No preauricular or posterior auricular adenopathy.     Cervical: No cervical adenopathy.     Upper Body:     Right upper body: No supraclavicular, axillary or epitrochlear adenopathy.     Left upper body: No supraclavicular, axillary or epitrochlear adenopathy.     Lower Body: No  right inguinal adenopathy. No left inguinal adenopathy.  Skin:    General: Skin is warm and dry.     Findings: Rash present. No lesion.     Comments: Red flat rash on chest and abdomen  Neurological:     Mental Status: He is alert and oriented to person, place, and time.    Assessment and Plan:  Erik Rodriguez is a 52 y.o. male presenting to the [facility] Department for STI screening  1. Screening for venereal disease  - Chlamydia/GC NAA, Confirmation - Gram stain - HIV [place] [facility] - Syphilis Serology,  Lab - Gonococcus culture  2. Syphilis Has hx of syphilis. Patient had new partner about 1 month ago. About 1 week after encounter with new partner- patient states a painless lesion appeared on his genital. This has now almost healed. Flat red rash appeared over chest and back after lesion. I suspect this is a reexposure to Syphilis. Treated today with 1 dose of Bicillin.  - penicillin g benzathine (BICILLIN LA) 1200000 UNIT/2ML injection 2.4 Million Units  Patient does have STI symptoms Patient accepted all screenings including   Patient meets criteria for HepB screening? No. Ordered? not applicable Patient meets criteria for HepC screening? Yes. Ordered? Pt had testing recentlys Recommended condom use with all sex Discussed importance of condom use for STI prevent  Treat gram stain per standing order Discussed time line for State Lab results and that patient will be called with positive results and encouraged patient to call if he had not heard in 2 weeks Recommended returning for continued or worsening symptoms.   Return if symptoms worsen or fail to improve.  Total time spent 20 minutes.   Sharlet Salina, [place]

## 2022-07-26 NOTE — Progress Notes (Signed)
Pt is here for STD screening.  Pt has had some sx's.  Gram stain results reviewed.  Bicillin 2.4 MU given IM without any complications.  Pt tolerated well.  Condoms given.  Windle Guard, RN

## 2022-07-31 LAB — GONOCOCCUS CULTURE

## 2022-07-31 LAB — CHLAMYDIA/GC NAA, CONFIRMATION
Chlamydia trachomatis, NAA: NEGATIVE
Neisseria gonorrhoeae, NAA: NEGATIVE

## 2022-08-17 NOTE — Addendum Note (Signed)
Addended by: Cletis Media on: 08/17/2022 03:42 PM   Modules accepted: Orders

## 2023-10-23 ENCOUNTER — Ambulatory Visit: Admitting: Family Medicine

## 2023-10-23 DIAGNOSIS — Z113 Encounter for screening for infections with a predominantly sexual mode of transmission: Secondary | ICD-10-CM

## 2023-10-23 LAB — HM HIV SCREENING LAB: HM HIV Screening: NEGATIVE

## 2023-10-23 NOTE — Patient Instructions (Signed)
 STI screening - Today we obtained a vaginal swab to screen for gonorrhea, chlamydia, and trichomonas - We also obtained a blood sample to screen for HIV and syphilis - If the results are abnormal, I will give you a call.    Estimated time frame for results collected at the [facility] Department: Same day Trichomonas Yeast BV (bacterial vaginosis)  Within 1-2 weeks Gonorrhea Chlamydia  Within 2-3 weeks HIV Syphilis Hepatitis B Hepatitis C

## 2023-10-23 NOTE — Progress Notes (Signed)
 [facility] Department STI clinic [address] [place] [address] Main phone: [phone]  STI screening visit  Subjective:  Erik Rodriguez is a 53 y.o. male being seen today for an STI screening visit. The patient reports they do not have symptoms.    Patient has the following medical conditions:  Patient Active Problem List   Diagnosis Date Noted   Erectile dysfunction 03/15/2022   Hypertension 12/04/2017   Cholesteatoma 11/24/2014   Chief Complaint  Patient presents with   SEXUALLY TRANSMITTED DISEASE   HPI Patient reports desire for routine STI screening. Asymptomatic today. No concerns.   STI screening history: Last HIV test per patient/review of record was  Lab Results  Component Value Date   HMHIVSCREEN Negative - Validated 07/26/2022    Lab Results  Component Value Date   HIV Non Reactive 12/04/2017   Last HEPC test per patient/review of record was  Lab Results  Component Value Date   HMHEPCSCREEN Negative-Validated 05/27/2019   No components found for: "HEPC"   Last HEPB test per patient/review of record was No components found for: "HMHEPBSCREEN"   Fertility: Does the patient or their partner desires a pregnancy in the next year? No  Screening for MPX risk: Does the patient have an unexplained rash? No Is the patient MSM? No Does the patient endorse multiple sex partners or anonymous sex partners? No Did the patient have close or sexual contact with a person diagnosed with MPX? No Has the patient traveled outside the [place] where MPX is endemic? No Is there a high clinical suspicion for MPX-- evidenced by one of the following No  -Unlikely to be chickenpox  -Lymphadenopathy  -Rash that present in same phase of evolution on any given body part  See flowsheet for further details and programmatic requirements.   Immunization History  Administered Date(s) Administered   Td 12/04/2017   Tdap 09/29/2021     The following portions of the patient's history were reviewed and updated as appropriate: allergies, current medications, past medical history, past social history, past surgical history and problem list.  Objective:  There were no vitals filed for this visit.  Physical Exam Vitals and nursing note reviewed.  Constitutional:      General: He is not in acute distress.    Appearance: Normal appearance. He is not ill-appearing, toxic-appearing or diaphoretic.  HENT:     Head: Normocephalic and atraumatic.     Nose: Nose normal. No congestion.     Mouth/Throat:     Mouth: Mucous membranes are moist.     Pharynx: No oropharyngeal exudate or posterior oropharyngeal erythema.  Eyes:     General: No scleral icterus.       Right eye: No discharge.        Left eye: No discharge.     Conjunctiva/sclera: Conjunctivae normal.     Right eye: Right conjunctiva is not injected. No exudate.    Left eye: Left conjunctiva is not injected. No exudate. Pulmonary:     Effort: Pulmonary effort is normal.  Abdominal:     Palpations: There is no hepatomegaly.  Genitourinary:    Comments: Declined genital exam- asymptomatic Musculoskeletal:        General: Normal range of motion.     Cervical back: Neck supple. No rigidity or tenderness.  Lymphadenopathy:     Cervical: No cervical adenopathy.     Upper Body:     Right upper body: No supraclavicular or axillary adenopathy.  Left upper body: No supraclavicular or axillary adenopathy.  Skin:    General: Skin is warm and dry.     Capillary Refill: Capillary refill takes less than 2 seconds.     Findings: No erythema, lesion or rash.  Neurological:     General: No focal deficit present.     Mental Status: He is alert and oriented to person, place, and time.  Psychiatric:        Mood and Affect: Mood normal.        Behavior: Behavior normal.    Assessment and Plan:  Erik Rodriguez is a 53 y.o. male presenting to the [facility] Department for STI screening  Screening examination for venereal disease -     Chlamydia/GC NAA, Confirmation -     Syphilis Serology, [place] [facility] -     Gonococcus culture   Patient does not have STI symptoms Patient accepted all screenings including  urine GC/Chlamydia, and blood work for HIV/Syphilis. Patient meets criteria for HepB screening? No. Ordered? not applicable Patient meets criteria for HepC screening? No. Ordered? not applicable Recommended condom use with all sex Discussed importance of condom use for STI prevention  Treat positive test results per standing order. Discussed time line for State Lab results and that patient will be called with positive results and encouraged patient to call if he had not heard in 2 weeks Recommended repeat testing in 3 months with positive results. Recommended returning for continued or worsening symptoms.   No follow-ups on file.  No future appointments.  Clydene Fake, MD

## 2023-10-23 NOTE — Progress Notes (Signed)
 Pt is here for std screening. Gaspar Garbe, RN

## 2023-10-26 LAB — CHLAMYDIA/GC NAA, CONFIRMATION
Chlamydia trachomatis, NAA: NEGATIVE
Neisseria gonorrhoeae, NAA: NEGATIVE

## 2024-01-04 IMAGING — DX DG FINGER LITTLE 2+V*R*
3 series · 3 of 3 positions shown · non-contrast
Comparison: None.

CLINICAL DATA: Fifth digit injury, hemorrhage, initial encounter.

EXAM:
RIGHT LITTLE FINGER 2+V

[finger ap]
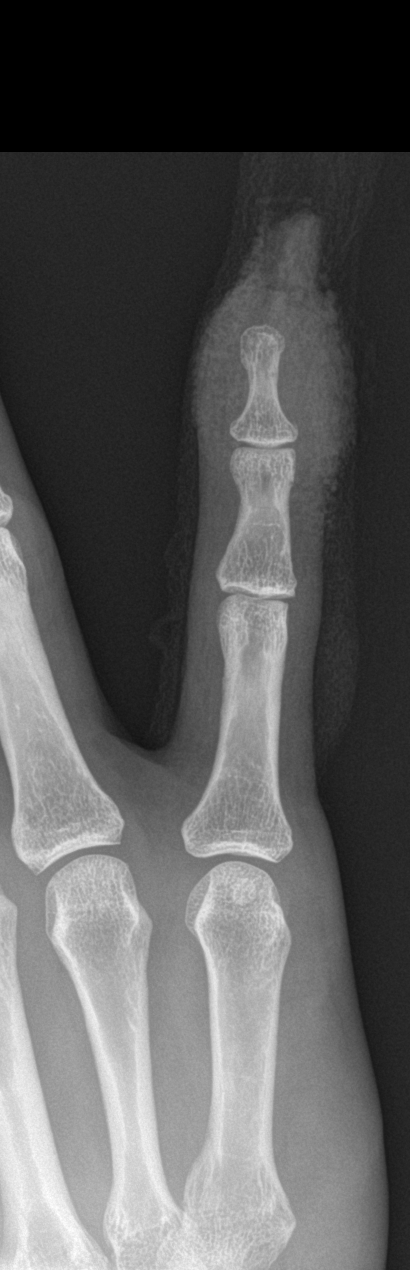

[finger obl]
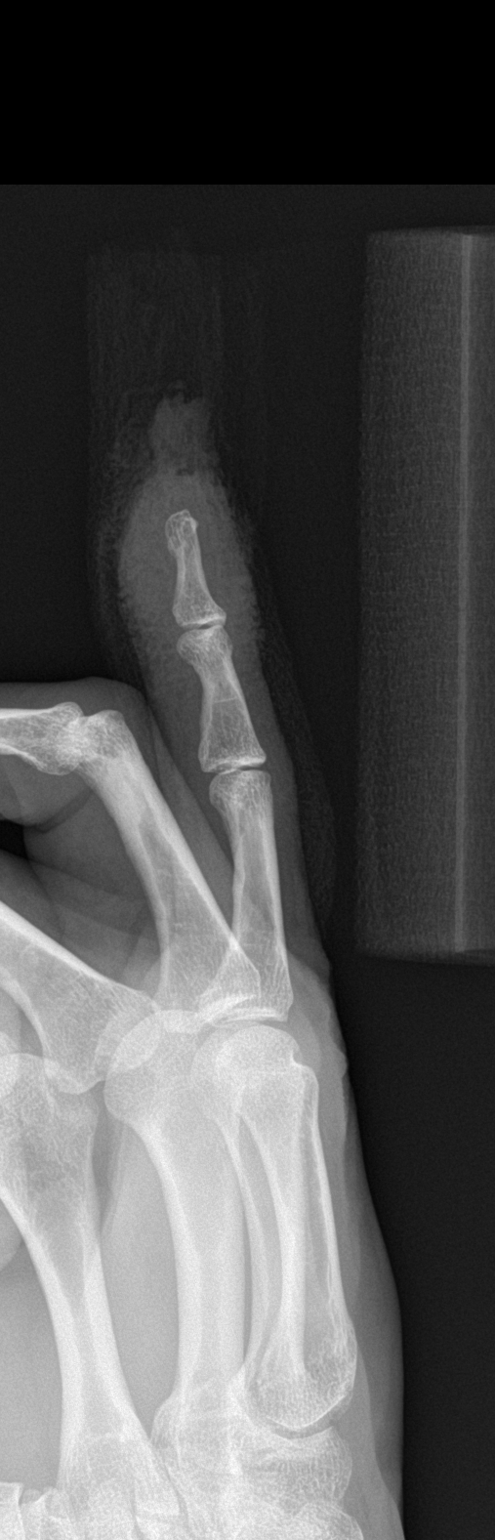

[finger lat]
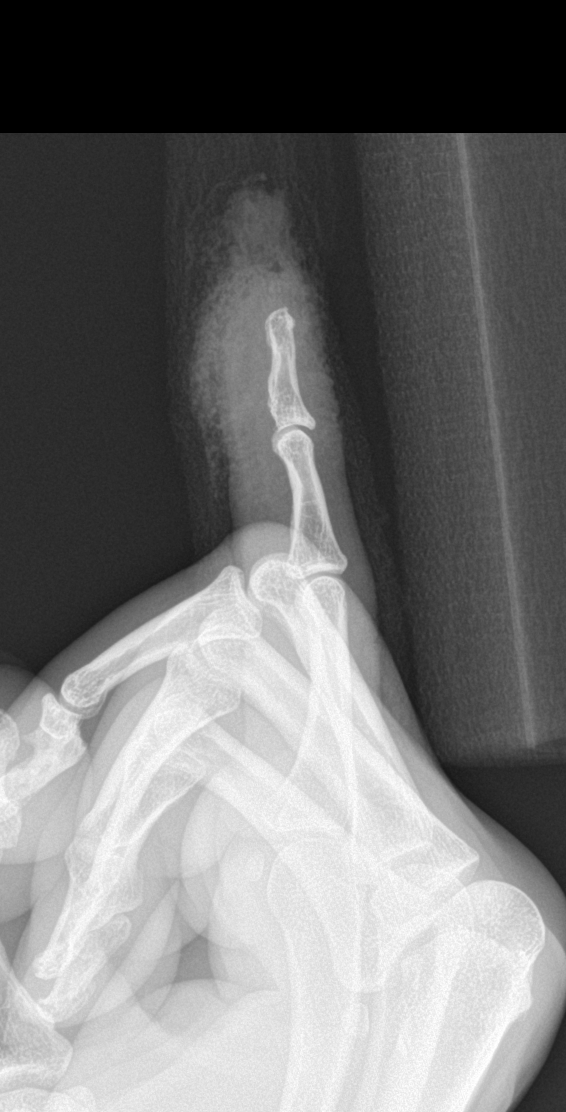

[3 of 3 positions shown; findings below may reference images not displayed]

FINDINGS: No acute osseous or joint abnormality. Soft tissue swelling around
the fifth distal phalanx. No radiopaque foreign body.
IMPRESSION: No acute osseous or joint abnormality.
